# Patient Record
Sex: Female | Born: 1950 | Race: White | Hispanic: No | Marital: Married | Smoking: Former smoker
Health system: Southern US, Community
[De-identification: ages and names within clinical notes are randomized; demographics above are authoritative.]

## PROBLEM LIST (undated history)

## (undated) DIAGNOSIS — N893 Dysplasia of vagina, unspecified: Secondary | ICD-10-CM

## (undated) DIAGNOSIS — J189 Pneumonia, unspecified organism: Secondary | ICD-10-CM

## (undated) DIAGNOSIS — E039 Hypothyroidism, unspecified: Secondary | ICD-10-CM

## (undated) DIAGNOSIS — I2510 Atherosclerotic heart disease of native coronary artery without angina pectoris: Secondary | ICD-10-CM

## (undated) DIAGNOSIS — M549 Dorsalgia, unspecified: Secondary | ICD-10-CM

## (undated) DIAGNOSIS — I10 Essential (primary) hypertension: Secondary | ICD-10-CM

## (undated) DIAGNOSIS — E785 Hyperlipidemia, unspecified: Secondary | ICD-10-CM

## (undated) DIAGNOSIS — K648 Other hemorrhoids: Secondary | ICD-10-CM

## (undated) DIAGNOSIS — K589 Irritable bowel syndrome without diarrhea: Secondary | ICD-10-CM

## (undated) DIAGNOSIS — M8580 Other specified disorders of bone density and structure, unspecified site: Secondary | ICD-10-CM

## (undated) HISTORY — PX: PELVIC LAPAROSCOPY: SHX162

## (undated) HISTORY — DX: Other specified disorders of bone density and structure, unspecified site: M85.80

## (undated) HISTORY — PX: LAPAROSCOPY ABDOMEN DIAGNOSTIC: PRO50

## (undated) HISTORY — PX: ABDOMINAL HYSTERECTOMY: SHX81

## (undated) HISTORY — DX: Essential (primary) hypertension: I10

## (undated) HISTORY — DX: Dorsalgia, unspecified: M54.9

## (undated) HISTORY — PX: LEFT OOPHORECTOMY: SHX1961

## (undated) HISTORY — DX: Other hemorrhoids: K64.8

## (undated) HISTORY — DX: Hyperlipidemia, unspecified: E78.5

## (undated) HISTORY — DX: Dysplasia of vagina, unspecified: N89.3

## (undated) HISTORY — DX: Irritable bowel syndrome, unspecified: K58.9

## (undated) HISTORY — DX: Hypothyroidism, unspecified: E03.9

## (undated) HISTORY — PX: EYE SURGERY: SHX253

---

## 1998-03-30 ENCOUNTER — Other Ambulatory Visit: Admission: RE | Admit: 1998-03-30 | Discharge: 1998-03-30 | Payer: Self-pay | Admitting: Gynecology

## 1999-01-24 ENCOUNTER — Ambulatory Visit (HOSPITAL_COMMUNITY): Admission: RE | Admit: 1999-01-24 | Discharge: 1999-01-24 | Payer: Self-pay

## 1999-06-28 ENCOUNTER — Encounter: Payer: Self-pay | Admitting: Internal Medicine

## 1999-06-28 ENCOUNTER — Ambulatory Visit (HOSPITAL_COMMUNITY): Admission: RE | Admit: 1999-06-28 | Discharge: 1999-06-28 | Payer: Self-pay | Admitting: Internal Medicine

## 2000-05-27 ENCOUNTER — Other Ambulatory Visit: Admission: RE | Admit: 2000-05-27 | Discharge: 2000-05-27 | Payer: Self-pay | Admitting: Gynecology

## 2000-07-16 ENCOUNTER — Encounter (INDEPENDENT_AMBULATORY_CARE_PROVIDER_SITE_OTHER): Payer: Self-pay

## 2000-07-16 ENCOUNTER — Other Ambulatory Visit: Admission: RE | Admit: 2000-07-16 | Discharge: 2000-07-16 | Payer: Self-pay | Admitting: Gynecology

## 2000-10-15 ENCOUNTER — Other Ambulatory Visit: Admission: RE | Admit: 2000-10-15 | Discharge: 2000-10-15 | Payer: Self-pay | Admitting: Gynecology

## 2000-11-11 DIAGNOSIS — N893 Dysplasia of vagina, unspecified: Secondary | ICD-10-CM

## 2000-11-11 HISTORY — DX: Dysplasia of vagina, unspecified: N89.3

## 2001-01-05 ENCOUNTER — Other Ambulatory Visit: Admission: RE | Admit: 2001-01-05 | Discharge: 2001-01-05 | Payer: Self-pay | Admitting: Gynecology

## 2001-02-23 ENCOUNTER — Other Ambulatory Visit: Admission: RE | Admit: 2001-02-23 | Discharge: 2001-02-23 | Payer: Self-pay | Admitting: Gynecology

## 2001-02-23 ENCOUNTER — Encounter (INDEPENDENT_AMBULATORY_CARE_PROVIDER_SITE_OTHER): Payer: Self-pay

## 2001-06-16 ENCOUNTER — Other Ambulatory Visit: Admission: RE | Admit: 2001-06-16 | Discharge: 2001-06-16 | Payer: Self-pay | Admitting: Gynecology

## 2001-10-12 ENCOUNTER — Other Ambulatory Visit: Admission: RE | Admit: 2001-10-12 | Discharge: 2001-10-12 | Payer: Self-pay | Admitting: Gynecology

## 2002-04-12 ENCOUNTER — Other Ambulatory Visit: Admission: RE | Admit: 2002-04-12 | Discharge: 2002-04-12 | Payer: Self-pay | Admitting: Gynecology

## 2002-07-15 ENCOUNTER — Other Ambulatory Visit: Admission: RE | Admit: 2002-07-15 | Discharge: 2002-07-15 | Payer: Self-pay | Admitting: Gynecology

## 2003-08-16 ENCOUNTER — Other Ambulatory Visit: Admission: RE | Admit: 2003-08-16 | Discharge: 2003-08-16 | Payer: Self-pay | Admitting: Gynecology

## 2004-02-17 ENCOUNTER — Emergency Department (HOSPITAL_COMMUNITY): Admission: EM | Admit: 2004-02-17 | Discharge: 2004-02-17 | Payer: Self-pay | Admitting: *Deleted

## 2004-09-06 ENCOUNTER — Other Ambulatory Visit: Admission: RE | Admit: 2004-09-06 | Discharge: 2004-09-06 | Payer: Self-pay | Admitting: Gynecology

## 2005-09-13 ENCOUNTER — Other Ambulatory Visit: Admission: RE | Admit: 2005-09-13 | Discharge: 2005-09-13 | Payer: Self-pay | Admitting: Gynecology

## 2005-10-10 ENCOUNTER — Encounter: Admission: RE | Admit: 2005-10-10 | Discharge: 2005-10-10 | Payer: Self-pay | Admitting: Family Medicine

## 2006-07-07 ENCOUNTER — Ambulatory Visit: Payer: Self-pay | Admitting: Family Medicine

## 2006-07-10 ENCOUNTER — Ambulatory Visit: Payer: Self-pay | Admitting: Family Medicine

## 2006-08-12 ENCOUNTER — Ambulatory Visit: Payer: Self-pay | Admitting: Family Medicine

## 2006-09-19 ENCOUNTER — Other Ambulatory Visit: Admission: RE | Admit: 2006-09-19 | Discharge: 2006-09-19 | Payer: Self-pay | Admitting: Gynecology

## 2006-10-22 ENCOUNTER — Ambulatory Visit: Payer: Self-pay | Admitting: Family Medicine

## 2006-10-22 LAB — CONVERTED CEMR LAB
AST: 27 units/L (ref 0–37)
BUN: 10 mg/dL (ref 6–23)
CO2: 28 meq/L (ref 19–32)
Calcium: 9.9 mg/dL (ref 8.4–10.5)
Creatinine, Ser: 0.7 mg/dL (ref 0.4–1.2)
Glomerular Filtration Rate, Af Am: 112 mL/min/{1.73_m2}
LDL DIRECT: 174.7 mg/dL

## 2006-10-29 ENCOUNTER — Ambulatory Visit: Payer: Self-pay | Admitting: Internal Medicine

## 2006-11-26 ENCOUNTER — Ambulatory Visit: Payer: Self-pay | Admitting: Internal Medicine

## 2007-01-02 ENCOUNTER — Ambulatory Visit: Payer: Self-pay | Admitting: Family Medicine

## 2007-01-02 LAB — CONVERTED CEMR LAB
ALT: 29 units/L (ref 0–40)
HDL: 58.2 mg/dL (ref >39.0)
Triglycerides: 64 mg/dL (ref 0–149)

## 2007-01-04 DIAGNOSIS — K649 Unspecified hemorrhoids: Secondary | ICD-10-CM | POA: Insufficient documentation

## 2007-01-04 DIAGNOSIS — J309 Allergic rhinitis, unspecified: Secondary | ICD-10-CM | POA: Insufficient documentation

## 2007-01-04 DIAGNOSIS — I10 Essential (primary) hypertension: Secondary | ICD-10-CM | POA: Insufficient documentation

## 2007-01-28 ENCOUNTER — Ambulatory Visit: Payer: Self-pay | Admitting: Family Medicine

## 2007-01-28 LAB — CONVERTED CEMR LAB: TSH: 3.34 microintl units/mL (ref 0.35–5.50)

## 2007-01-29 ENCOUNTER — Encounter: Admission: RE | Admit: 2007-01-29 | Discharge: 2007-01-29 | Payer: Self-pay | Admitting: Family Medicine

## 2007-03-03 ENCOUNTER — Encounter: Admission: RE | Admit: 2007-03-03 | Discharge: 2007-05-22 | Payer: Self-pay | Admitting: Family Medicine

## 2007-03-03 ENCOUNTER — Encounter (INDEPENDENT_AMBULATORY_CARE_PROVIDER_SITE_OTHER): Payer: Self-pay | Admitting: Family Medicine

## 2007-05-01 ENCOUNTER — Ambulatory Visit: Payer: Self-pay | Admitting: Family Medicine

## 2007-05-01 DIAGNOSIS — M549 Dorsalgia, unspecified: Secondary | ICD-10-CM | POA: Insufficient documentation

## 2007-05-03 LAB — CONVERTED CEMR LAB
CO2: 34 meq/L — ABNORMAL HIGH (ref 19–32)
Chloride: 104 meq/L (ref 96–112)
Cholesterol: 168 mg/dL (ref 0–200)
GFR calc non Af Amer: 111 mL/min
Glucose, Bld: 86 mg/dL (ref 70–99)
HDL: 56.9 mg/dL (ref >39.0)
LDL Cholesterol: 95 mg/dL (ref 0–99)
Sodium: 141 meq/L (ref 135–145)

## 2007-05-04 ENCOUNTER — Telehealth (INDEPENDENT_AMBULATORY_CARE_PROVIDER_SITE_OTHER): Payer: Self-pay | Admitting: *Deleted

## 2007-09-09 ENCOUNTER — Telehealth (INDEPENDENT_AMBULATORY_CARE_PROVIDER_SITE_OTHER): Payer: Self-pay | Admitting: *Deleted

## 2007-09-17 ENCOUNTER — Ambulatory Visit: Payer: Self-pay | Admitting: Family Medicine

## 2007-09-17 LAB — CONVERTED CEMR LAB
CO2: 31 meq/L (ref 19–32)
Calcium: 9.8 mg/dL (ref 8.4–10.5)
Chloride: 102 meq/L (ref 96–112)
Creatinine, Ser: 0.6 mg/dL (ref 0.4–1.2)
Glucose, Bld: 88 mg/dL (ref 70–99)

## 2007-09-18 ENCOUNTER — Telehealth (INDEPENDENT_AMBULATORY_CARE_PROVIDER_SITE_OTHER): Payer: Self-pay | Admitting: *Deleted

## 2007-09-18 ENCOUNTER — Encounter (INDEPENDENT_AMBULATORY_CARE_PROVIDER_SITE_OTHER): Payer: Self-pay | Admitting: *Deleted

## 2007-09-22 ENCOUNTER — Other Ambulatory Visit: Admission: RE | Admit: 2007-09-22 | Discharge: 2007-09-22 | Payer: Self-pay | Admitting: Gynecology

## 2008-01-13 ENCOUNTER — Ambulatory Visit: Payer: Self-pay | Admitting: Family Medicine

## 2008-01-13 LAB — CONVERTED CEMR LAB
ALT: 24 units/L (ref 0–35)
Cholesterol: 167 mg/dL (ref 0–200)
GFR calc non Af Amer: 110 mL/min
Glucose, Bld: 95 mg/dL (ref 70–99)
HDL: 52.2 mg/dL (ref >39.0)
LDL Cholesterol: 86 mg/dL (ref 0–99)
Potassium: 4.6 meq/L (ref 3.5–5.1)
Sodium: 139 meq/L (ref 135–145)
Total CHOL/HDL Ratio: 3.2
Triglycerides: 146 mg/dL (ref 0–149)
VLDL: 29 mg/dL (ref 0–40)

## 2008-01-14 ENCOUNTER — Encounter (INDEPENDENT_AMBULATORY_CARE_PROVIDER_SITE_OTHER): Payer: Self-pay | Admitting: *Deleted

## 2008-06-09 ENCOUNTER — Ambulatory Visit: Payer: Self-pay | Admitting: Family Medicine

## 2008-06-09 DIAGNOSIS — E785 Hyperlipidemia, unspecified: Secondary | ICD-10-CM | POA: Insufficient documentation

## 2008-06-09 DIAGNOSIS — E039 Hypothyroidism, unspecified: Secondary | ICD-10-CM | POA: Insufficient documentation

## 2008-06-16 ENCOUNTER — Encounter (INDEPENDENT_AMBULATORY_CARE_PROVIDER_SITE_OTHER): Payer: Self-pay | Admitting: *Deleted

## 2008-06-16 LAB — CONVERTED CEMR LAB
ALT: 23 units/L (ref 0–35)
AST: 27 units/L (ref 0–37)
Bilirubin, Direct: 0.1 mg/dL (ref 0.0–0.3)
CO2: 29 meq/L (ref 19–32)
Calcium: 9.5 mg/dL (ref 8.4–10.5)
Chloride: 103 meq/L (ref 96–112)
Free T4: 0.8 ng/dL (ref 0.6–1.6)
GFR calc non Af Amer: 110 mL/min
LDL Cholesterol: 100 mg/dL — ABNORMAL HIGH (ref 0–99)
Sodium: 140 meq/L (ref 135–145)
T3, Free: 2.9 pg/mL (ref 2.3–4.2)
Total Bilirubin: 0.7 mg/dL (ref 0.3–1.2)
Total CHOL/HDL Ratio: 3.4

## 2008-09-14 ENCOUNTER — Ambulatory Visit: Payer: Self-pay | Admitting: Family Medicine

## 2008-09-22 ENCOUNTER — Encounter: Payer: Self-pay | Admitting: Gynecology

## 2008-09-22 ENCOUNTER — Other Ambulatory Visit: Admission: RE | Admit: 2008-09-22 | Discharge: 2008-09-22 | Payer: Self-pay | Admitting: Gynecology

## 2008-09-22 ENCOUNTER — Ambulatory Visit: Payer: Self-pay | Admitting: Gynecology

## 2008-10-18 ENCOUNTER — Ambulatory Visit: Payer: Self-pay | Admitting: Gynecology

## 2008-12-01 DIAGNOSIS — K589 Irritable bowel syndrome without diarrhea: Secondary | ICD-10-CM

## 2008-12-07 ENCOUNTER — Ambulatory Visit: Payer: Self-pay | Admitting: Internal Medicine

## 2008-12-07 DIAGNOSIS — K5900 Constipation, unspecified: Secondary | ICD-10-CM | POA: Insufficient documentation

## 2008-12-07 DIAGNOSIS — K3189 Other diseases of stomach and duodenum: Secondary | ICD-10-CM

## 2008-12-07 DIAGNOSIS — R1013 Epigastric pain: Secondary | ICD-10-CM

## 2008-12-09 ENCOUNTER — Ambulatory Visit (HOSPITAL_COMMUNITY): Admission: RE | Admit: 2008-12-09 | Discharge: 2008-12-09 | Payer: Self-pay | Admitting: Internal Medicine

## 2008-12-13 ENCOUNTER — Ambulatory Visit: Payer: Self-pay | Admitting: Family Medicine

## 2008-12-14 ENCOUNTER — Telehealth (INDEPENDENT_AMBULATORY_CARE_PROVIDER_SITE_OTHER): Payer: Self-pay | Admitting: *Deleted

## 2008-12-14 LAB — CONVERTED CEMR LAB
AST: 35 units/L (ref 0–37)
Albumin: 4 g/dL (ref 3.5–5.2)
Alkaline Phosphatase: 53 units/L (ref 39–117)
Basophils Absolute: 0 10*3/uL (ref 0.0–0.1)
Bilirubin, Direct: 0.1 mg/dL (ref 0.0–0.3)
Chloride: 103 meq/L (ref 96–112)
Cholesterol: 220 mg/dL (ref 0–200)
Eosinophils Absolute: 0.2 10*3/uL (ref 0.0–0.7)
Eosinophils Relative: 3.9 % (ref 0.0–5.0)
GFR calc Af Amer: 111 mL/min
GFR calc non Af Amer: 92 mL/min
HCT: 40.9 % (ref 36.0–46.0)
HDL: 62.9 mg/dL (ref >39.0)
MCV: 86.7 fL (ref 78.0–100.0)
Monocytes Absolute: 0.2 10*3/uL (ref 0.1–1.0)
Neutrophils Relative %: 58.2 % (ref 43.0–77.0)
Platelets: 245 10*3/uL (ref 150–400)
Potassium: 4.6 meq/L (ref 3.5–5.1)
RDW: 11.8 % (ref 11.5–14.6)
Sodium: 139 meq/L (ref 135–145)
Total Bilirubin: 0.6 mg/dL (ref 0.3–1.2)
Triglycerides: 86 mg/dL (ref 0–149)
WBC: 5.2 10*3/uL (ref 4.5–10.5)

## 2009-01-06 ENCOUNTER — Telehealth (INDEPENDENT_AMBULATORY_CARE_PROVIDER_SITE_OTHER): Payer: Self-pay | Admitting: *Deleted

## 2009-02-02 ENCOUNTER — Telehealth (INDEPENDENT_AMBULATORY_CARE_PROVIDER_SITE_OTHER): Payer: Self-pay | Admitting: *Deleted

## 2009-03-15 ENCOUNTER — Ambulatory Visit: Payer: Self-pay | Admitting: Family Medicine

## 2009-03-15 ENCOUNTER — Encounter (INDEPENDENT_AMBULATORY_CARE_PROVIDER_SITE_OTHER): Payer: Self-pay | Admitting: *Deleted

## 2009-03-15 LAB — CONVERTED CEMR LAB: TSH: 2.52 microintl units/mL (ref 0.35–5.50)

## 2009-06-13 ENCOUNTER — Ambulatory Visit: Payer: Self-pay | Admitting: Family Medicine

## 2009-06-15 ENCOUNTER — Telehealth (INDEPENDENT_AMBULATORY_CARE_PROVIDER_SITE_OTHER): Payer: Self-pay | Admitting: *Deleted

## 2009-06-19 ENCOUNTER — Ambulatory Visit: Payer: Self-pay | Admitting: Family Medicine

## 2009-06-19 DIAGNOSIS — J029 Acute pharyngitis, unspecified: Secondary | ICD-10-CM

## 2009-08-12 IMAGING — US US ABDOMEN COMPLETE
1 series · 14 of 25 positions shown · non-contrast
Comparison: None.

CLINICAL DATA: Nausea.  Left upper quadrant pain.

ABDOMEN ULTRASOUND
TECHNIQUE: Complete abdominal ultrasound examination was performed
including evaluation of the liver, gallbladder, bile ducts,
pancreas, kidneys, spleen, IVC, and abdominal aorta.

[Series 1: us abdomen complete · 0.21mm/px · 14 of 105 slices shown]
[im 1/105]
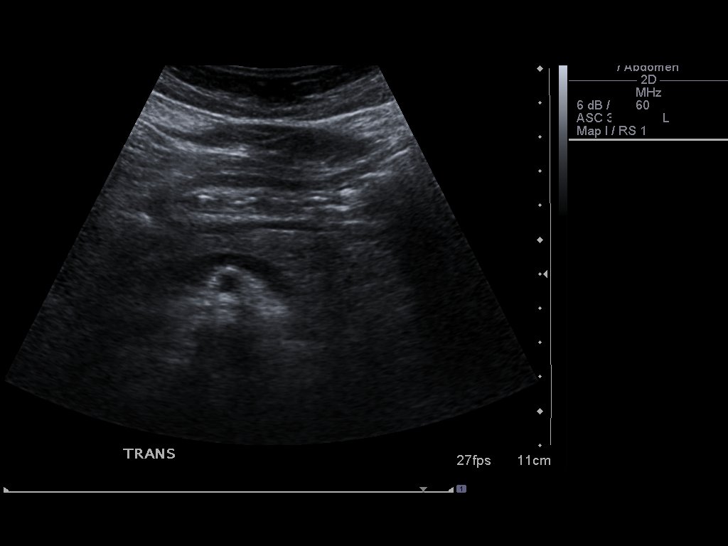
[im 9/105]
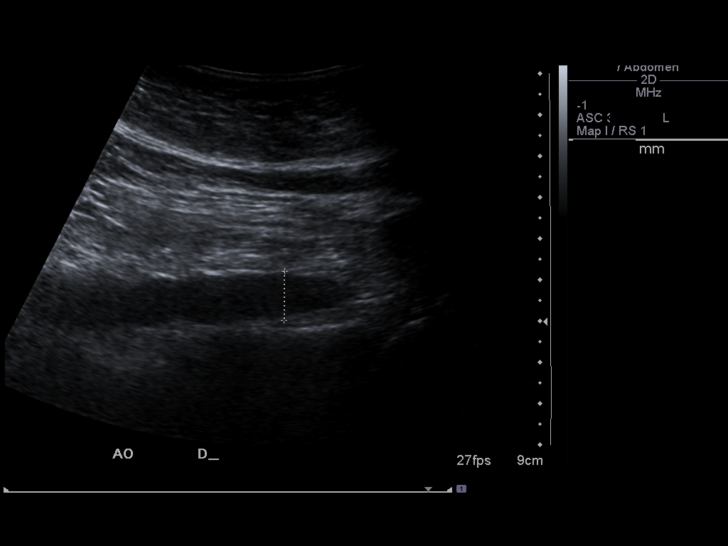
[im 18/105]
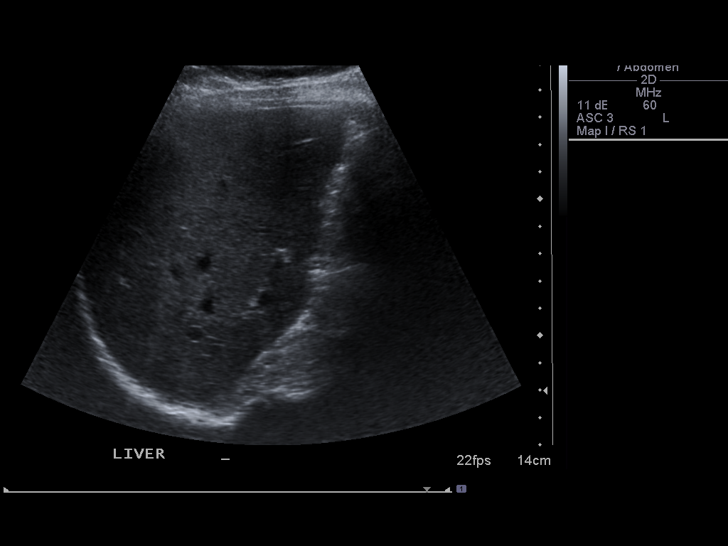
[im 27/105]
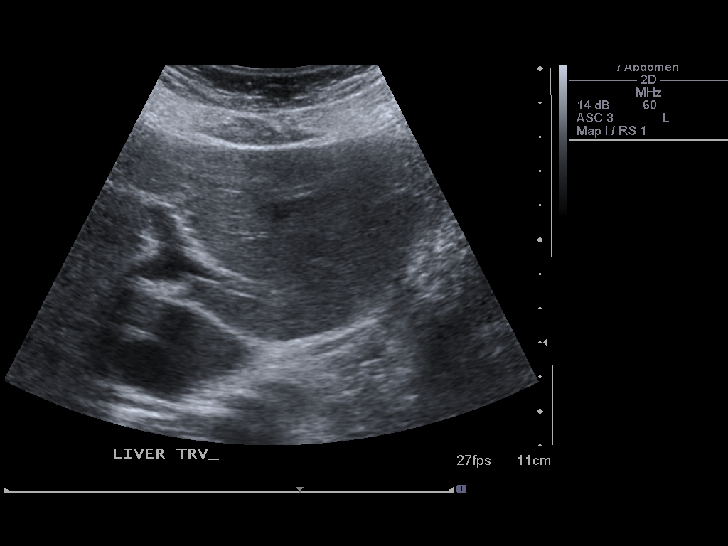
[im 35/105]
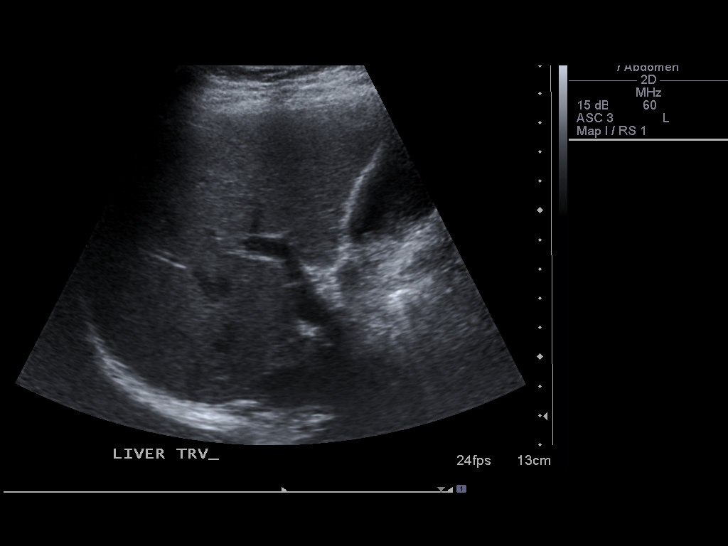
[im 40/105]
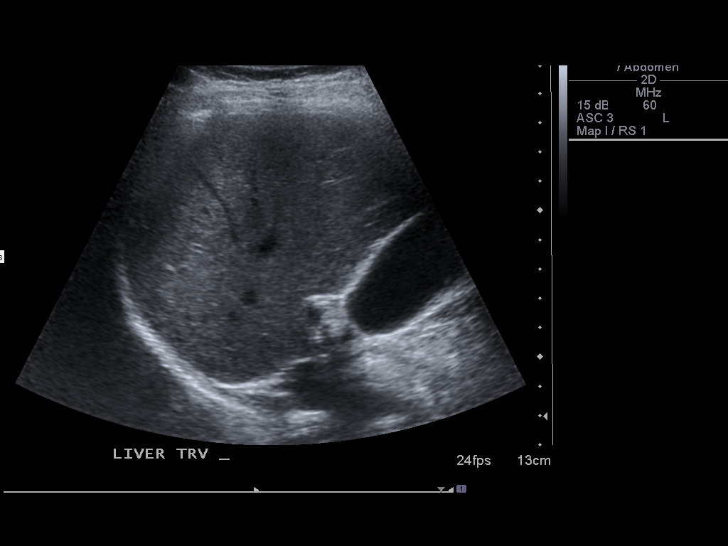
[im 48/105]
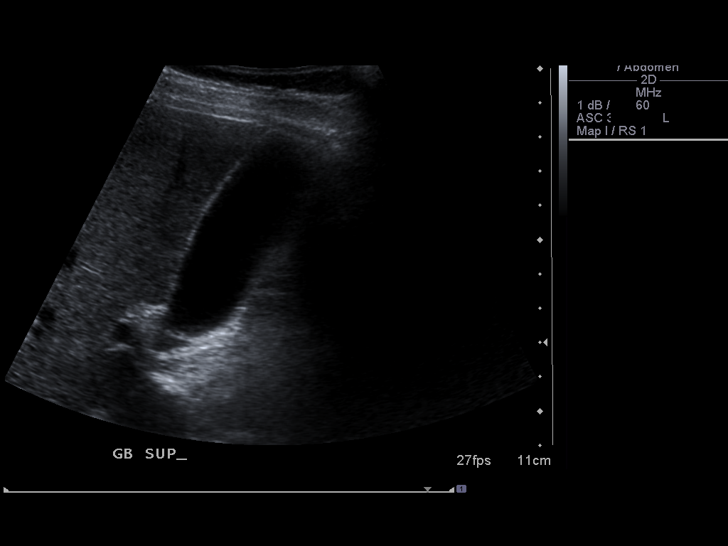
[im 57/105]
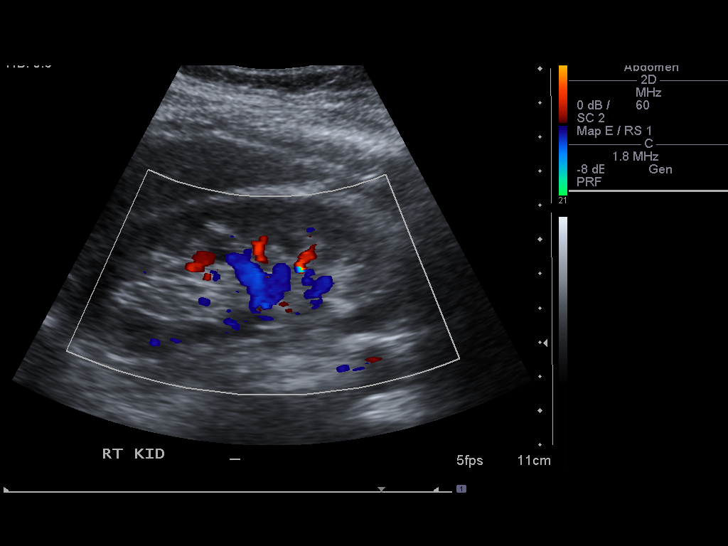
[im 66/105]
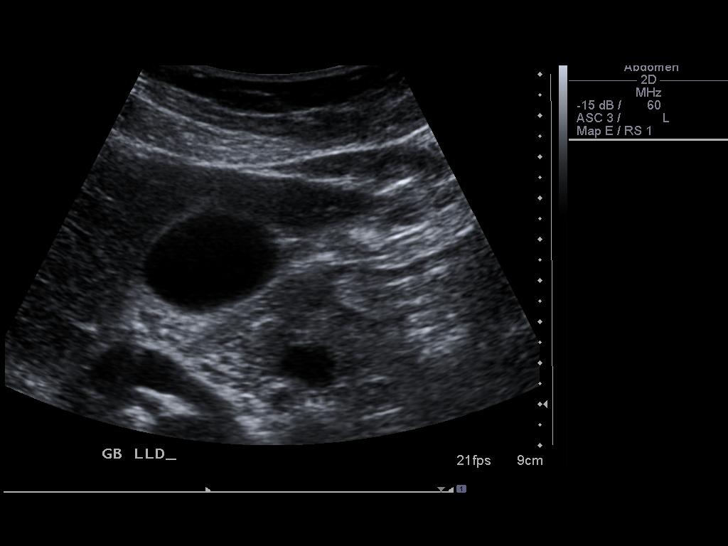
[im 70/105]
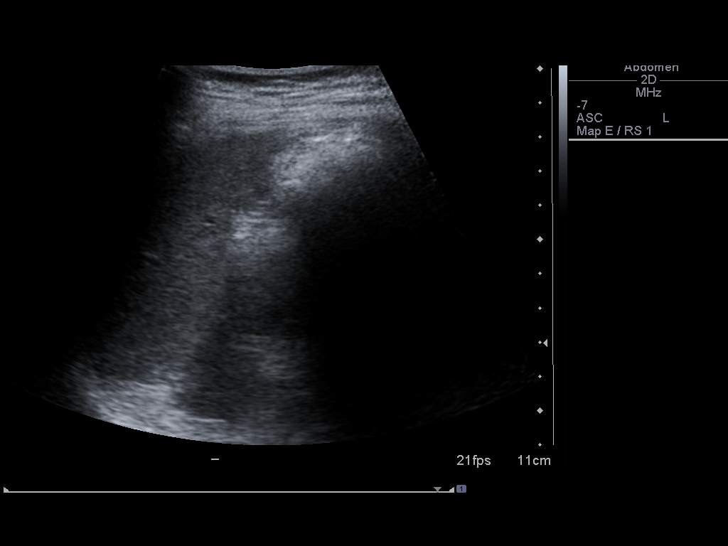
[im 79/105]
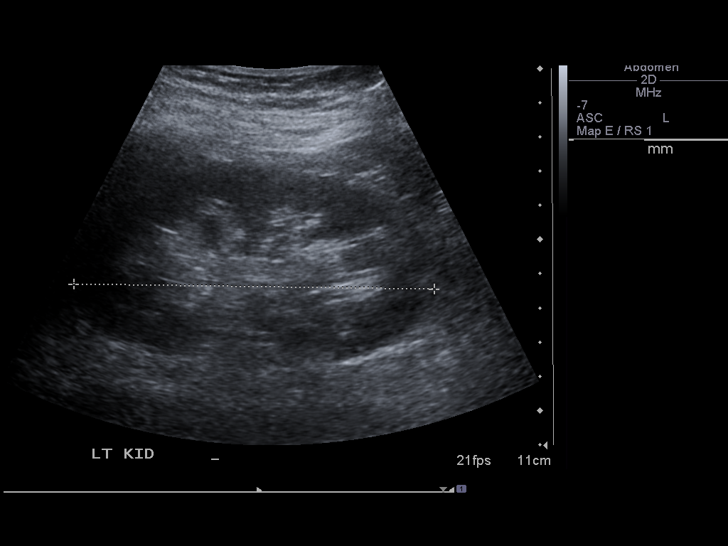
[im 87/105]
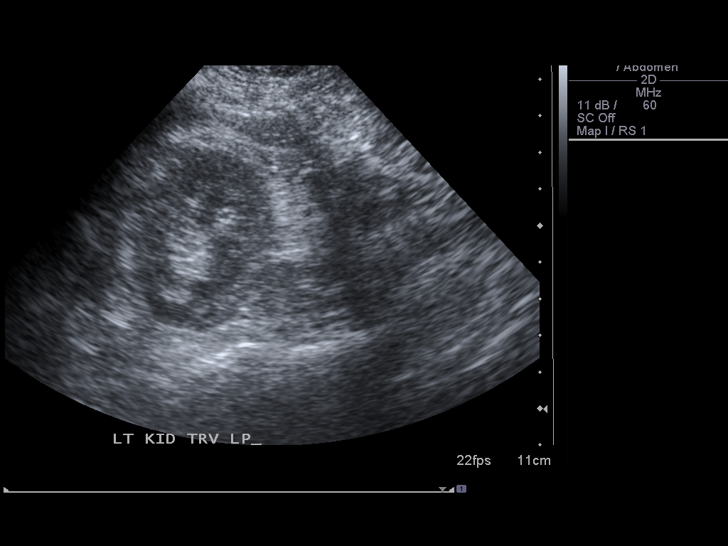
[im 96/105]
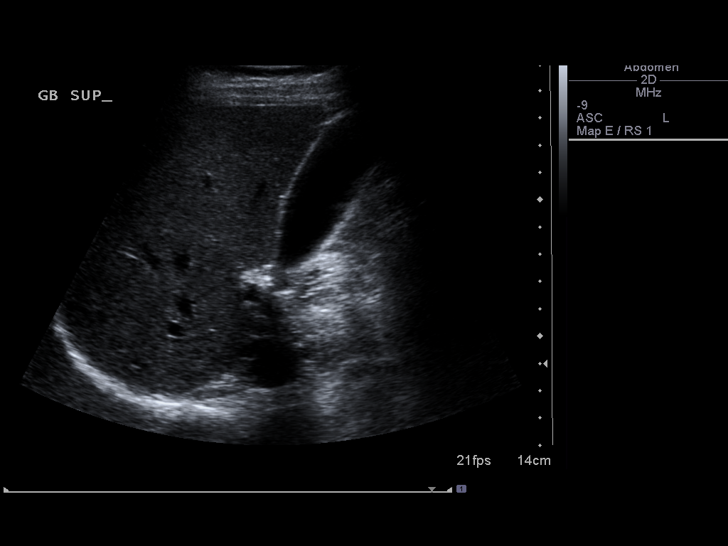
[im 105/105]
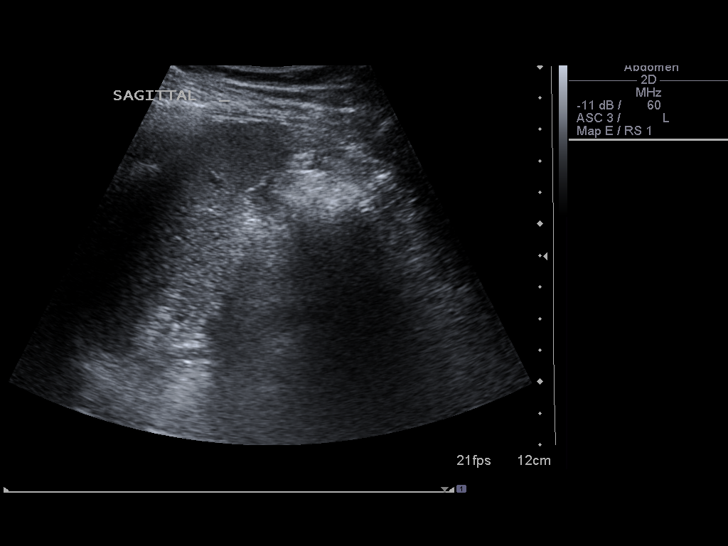

[14 of 25 positions shown; findings below may reference images not displayed]

FINDINGS: Gallbladder:  There is no evidence for gallstones, gallbladder wall
thickening or pericholecystic fluid.  4 mm cholesterol polyp
identified.  The sonographer reports no sonographic Murphy's sign.

Common Bile Duct:  Nondilated at 5 mm diameter.

Liver:  Normal

IVC:  Normal

Pancreas:  Normal

Spleen:  Normal

Right kidney:  11.6 cm in long axis.  Normal

Left kidney:  11.1 cm in long axis.  Normal

Abdominal Aorta:  No aneurysm.  Aorta is 2.0 cm in maximum
diameter.
IMPRESSION: Tiny cholesterol polyp in the gallbladder.  Otherwise normal exam.

## 2009-08-29 LAB — HM MAMMOGRAPHY: HM Mammogram: NORMAL

## 2009-09-25 ENCOUNTER — Encounter: Payer: Self-pay | Admitting: Gynecology

## 2009-09-25 ENCOUNTER — Ambulatory Visit: Payer: Self-pay | Admitting: Gynecology

## 2009-09-25 ENCOUNTER — Other Ambulatory Visit: Admission: RE | Admit: 2009-09-25 | Discharge: 2009-09-25 | Payer: Self-pay | Admitting: Gynecology

## 2009-10-25 ENCOUNTER — Telehealth (INDEPENDENT_AMBULATORY_CARE_PROVIDER_SITE_OTHER): Payer: Self-pay | Admitting: *Deleted

## 2009-10-27 ENCOUNTER — Encounter (INDEPENDENT_AMBULATORY_CARE_PROVIDER_SITE_OTHER): Payer: Self-pay | Admitting: *Deleted

## 2009-10-31 ENCOUNTER — Ambulatory Visit: Payer: Self-pay | Admitting: Family

## 2009-10-31 DIAGNOSIS — G4700 Insomnia, unspecified: Secondary | ICD-10-CM | POA: Insufficient documentation

## 2009-11-02 ENCOUNTER — Encounter: Payer: Self-pay | Admitting: Internal Medicine

## 2009-11-02 ENCOUNTER — Telehealth (INDEPENDENT_AMBULATORY_CARE_PROVIDER_SITE_OTHER): Payer: Self-pay | Admitting: *Deleted

## 2009-11-08 ENCOUNTER — Telehealth: Payer: Self-pay | Admitting: Internal Medicine

## 2009-11-14 ENCOUNTER — Telehealth (INDEPENDENT_AMBULATORY_CARE_PROVIDER_SITE_OTHER): Payer: Self-pay | Admitting: *Deleted

## 2009-11-16 ENCOUNTER — Ambulatory Visit: Payer: Self-pay | Admitting: Family Medicine

## 2009-11-17 LAB — CONVERTED CEMR LAB
Bilirubin, Direct: 0 mg/dL (ref 0.0–0.3)
Total Bilirubin: 0.7 mg/dL (ref 0.3–1.2)

## 2009-12-14 ENCOUNTER — Telehealth (INDEPENDENT_AMBULATORY_CARE_PROVIDER_SITE_OTHER): Payer: Self-pay | Admitting: *Deleted

## 2010-02-16 ENCOUNTER — Ambulatory Visit: Payer: Self-pay | Admitting: Family Medicine

## 2010-02-16 LAB — CONVERTED CEMR LAB
Calcium: 10 mg/dL (ref 8.4–10.5)
Creatinine, Ser: 0.5 mg/dL (ref 0.4–1.2)
GFR calc non Af Amer: 134.98 mL/min (ref >60)
HDL: 58.8 mg/dL (ref >39.00)
LDL Cholesterol: 88 mg/dL (ref 0–99)
Total CHOL/HDL Ratio: 3
Triglycerides: 70 mg/dL (ref 0.0–149.0)
VLDL: 14 mg/dL (ref 0.0–40.0)

## 2010-02-20 ENCOUNTER — Telehealth (INDEPENDENT_AMBULATORY_CARE_PROVIDER_SITE_OTHER): Payer: Self-pay | Admitting: *Deleted

## 2010-03-14 ENCOUNTER — Ambulatory Visit: Payer: Self-pay | Admitting: Family Medicine

## 2010-03-14 DIAGNOSIS — M79609 Pain in unspecified limb: Secondary | ICD-10-CM | POA: Insufficient documentation

## 2010-05-02 ENCOUNTER — Encounter: Payer: Self-pay | Admitting: Family Medicine

## 2010-08-15 ENCOUNTER — Ambulatory Visit: Payer: Self-pay | Admitting: Family Medicine

## 2010-08-16 LAB — CONVERTED CEMR LAB
Alkaline Phosphatase: 67 units/L (ref 39–117)
BUN: 19 mg/dL (ref 6–23)
Basophils Absolute: 0 10*3/uL (ref 0.0–0.1)
Bilirubin, Direct: 0.1 mg/dL (ref 0.0–0.3)
Chloride: 102 meq/L (ref 96–112)
Cholesterol: 187 mg/dL (ref 0–200)
Creatinine, Ser: 0.6 mg/dL (ref 0.4–1.2)
Eosinophils Absolute: 0.2 10*3/uL (ref 0.0–0.7)
Eosinophils Relative: 3.9 % (ref 0.0–5.0)
LDL Cholesterol: 101 mg/dL — ABNORMAL HIGH (ref 0–99)
MCHC: 33.9 g/dL (ref 30.0–36.0)
MCV: 87.2 fL (ref 78.0–100.0)
Monocytes Absolute: 0.3 10*3/uL (ref 0.1–1.0)
Neutrophils Relative %: 53.6 % (ref 43.0–77.0)
Platelets: 272 10*3/uL (ref 150.0–400.0)
TSH: 2.76 microintl units/mL (ref 0.35–5.50)
Total Protein: 7.1 g/dL (ref 6.0–8.3)
Vit D, 25-Hydroxy: 53 ng/mL (ref 30–89)
WBC: 5 10*3/uL (ref 4.5–10.5)

## 2010-08-23 ENCOUNTER — Telehealth (INDEPENDENT_AMBULATORY_CARE_PROVIDER_SITE_OTHER): Payer: Self-pay | Admitting: *Deleted

## 2010-09-26 ENCOUNTER — Ambulatory Visit: Payer: Self-pay | Admitting: Gynecology

## 2010-09-26 ENCOUNTER — Other Ambulatory Visit: Admission: RE | Admit: 2010-09-26 | Discharge: 2010-09-26 | Payer: Self-pay | Admitting: Gynecology

## 2010-10-23 ENCOUNTER — Telehealth: Payer: Self-pay | Admitting: Family Medicine

## 2010-10-30 ENCOUNTER — Ambulatory Visit: Payer: Self-pay | Admitting: Gynecology

## 2010-12-01 ENCOUNTER — Encounter: Payer: Self-pay | Admitting: Family Medicine

## 2010-12-09 LAB — CONVERTED CEMR LAB
Albumin: 4.3 g/dL (ref 3.5–5.2)
Alkaline Phosphatase: 69 units/L (ref 39–117)
Basophils Relative: 0.4 % (ref 0.0–3.0)
Calcium: 9.9 mg/dL (ref 8.4–10.5)
Chloride: 106 meq/L (ref 96–112)
Cholesterol: 226 mg/dL — ABNORMAL HIGH (ref 0–200)
Creatinine, Ser: 0.7 mg/dL (ref 0.4–1.2)
Direct LDL: 151.5 mg/dL
Eosinophils Relative: 4.7 % (ref 0.0–5.0)
Hemoglobin: 14.6 g/dL (ref 12.0–15.0)
Lymphocytes Relative: 35.8 % (ref 12.0–46.0)
MCV: 87.6 fL (ref 78.0–100.0)
Neutro Abs: 2.1 10*3/uL (ref 1.4–7.7)
Neutrophils Relative %: 52.4 % (ref 43.0–77.0)
RBC: 4.81 M/uL (ref 3.87–5.11)
Sodium: 144 meq/L (ref 135–145)
Total CHOL/HDL Ratio: 4
Total Protein: 7.7 g/dL (ref 6.0–8.3)
Triglycerides: 112 mg/dL (ref 0.0–149.0)
VLDL: 22.4 mg/dL (ref 0.0–40.0)
Vit D, 25-Hydroxy: 45 ng/mL (ref 30–89)
WBC: 4.1 10*3/uL — ABNORMAL LOW (ref 4.5–10.5)

## 2010-12-13 NOTE — Assessment & Plan Note (Signed)
Summary: cpx & lab/cbs  Flu Vaccine Consent Questions     Do you have a history of severe allergic reactions to this vaccine? no    Any prior history of allergic reactions to egg and/or gelatin? no    Do you have a sensitivity to the preservative Thimersol? no    Do you have a past history of Guillan-Barre Syndrome? no    Do you currently have an acute febrile illness? no    Have you ever had a severe reaction to latex? no    Vaccine information given and explained to patient? yes    Are you currently pregnant? no    Lot Number:[id number]   Exp Date:05/11/2011   Site Given  Right Deltoid IM    Vital Signs:  Patient profile:   60 year old female Height:      60 inches Weight:      114 pounds BMI:     22.34 Pulse rate:   80 / minute BP sitting:   120 / 78  (left arm)  Vitals Entered By: Doristine Devoid CMA (August 15, 2010 8:07 AM) CC: CPX AND LABS    History of Present Illness: 60 yo woman here today for CPE.  GYN- [facility].  UTD on mammogram, colonoscopy.  1) HTN- BP well controlled on amlodipine, HCTZ.  no CP, SOB, HAs, visual changes.  some edema of feet over the last few months.  2) Hyperlipidemia- tolerating statin w/out difficulty.  no N/V, myalgias.  3) Hypothyroid- feels sxs are well controlled at current dose, has been stable for awhile.  Preventive Screening-Counseling & Management  Alcohol-Tobacco     Alcohol drinks/day: <1     Smoking Status: never  Caffeine-Diet-Exercise     Does Patient Exercise: yes     Type of exercise: walking 3-4 miles     Times/week: 4      Sexual History:  currently monogamous.        Drug Use:  never.    Current Medications (verified): 1)  Xyzal 5 Mg  Tabs (Levocetirizine Dihydrochloride) .... Take One Tablet Daily 2)  Norvasc 5 Mg  Tabs (Amlodipine Besylate) .Marland Kitchen.. 1 By Mouth Qd 3)  Hydrochlorothiazide 25 Mg Tabs (Hydrochlorothiazide) .... Take 1  Tablet By Mouth Every Morning 4)  Synthroid 75 Mcg Tabs (Levothyroxine Sodium)  .... Take One Tablet Daily 5)  Calcium 600/vitamin D 600-400 Mg-Unit  Chew (Calcium Carbonate-Vitamin D) .Marland Kitchen.. 1 By Mouth Three Times A Day 6)  Levbid 0.375 Mg Xr12h-Tab (Hyoscyamine Sulfate) .... Take 1 Tablet By Mouth Two Times A Day 7)  Simvastatin 40 Mg Tabs (Simvastatin) .Marland Kitchen.. 1 By Mouth At Bedtime 8)  Ambien 5 Mg Tabs (Zolpidem Tartrate) .... One Tablet At Bedtime As Needed For Insomnia  Allergies (verified): 1)  ! Penicillin 2)  ! Sulfa 3)  ! Tetracycline  Past History:  Past Surgical History: Last updated: 12/01/2008 hysterectomy left oophorectomy  Family History: Last updated: 06/13/2009 Family History of Colitis/Crohn's: Sister Family History of Heart Disease: Mother Family History of Colon Cancer: Mother breast cancer- NO DM- mother HTN- mother, father, 2 sisters, son Hyperlipidemia- mother, father  Social History: Last updated: 06/13/2009 Occupation:  paralegal Married, 2 sons, 1 daughter- (66, 47, 19) Never Smoked Alcohol use-yes Drug use-no Regular exercise-yes  Review of Systems       The patient complains of peripheral edema.  The patient denies anorexia, fever, weight loss, weight gain, vision loss, decreased hearing, hoarseness, chest pain, syncope, dyspnea on exertion, prolonged  cough, headaches, abdominal pain, melena, hematochezia, severe indigestion/heartburn, hematuria, suspicious skin lesions, depression, abnormal bleeding, enlarged lymph nodes, and breast masses.    Physical Exam  General:  Well-developed,well-nourished,in no acute distress; alert,appropriate and cooperative throughout examination Head:  Normocephalic and atraumatic without obvious abnormalities.  Eyes:  PERRL, EOMI, fundi WNL Ears:  External ear exam shows no significant lesions or deformities.  Otoscopic examination reveals clear canals, tympanic membranes are intact bilaterally without bulging, retraction, inflammation or discharge. Hearing is grossly normal bilaterally. Nose:   External nasal examination shows no deformity or inflammation. Nasal mucosa are pink and moist without lesions or exudates. Mouth:  Oral mucosa and oropharynx without lesions or exudates.  Teeth in good repair.  Neck:  No deformities, masses, or tenderness noted. Breasts:  deferred to gyn Lungs:  Normal respiratory effort, chest expands symmetrically. Lungs are clear to auscultation, no crackles or wheezes. Heart:  Normal rate and regular rhythm. S1 and S2 normal without gallop, murmur, click, rub or other extra sounds. Abdomen:  soft, NT/ND, +BS Genitalia:  deferred to gyn Pulses:  +2 carotid, radial, DP Extremities:  No clubbing, cyanosis, edema  Neurologic:  No cranial nerve deficits noted. Station and gait are normal. Plantar reflexes are down-going bilaterally. DTRs are symmetrical throughout. Sensory, motor and coordinative functions appear intact. Skin:  Intact without suspicious lesions or rashes Cervical Nodes:  No lymphadenopathy noted Inguinal Nodes:  No significant adenopathy Psych:  Cognition and judgment appear intact. Alert and cooperative with normal attention span and concentration. No apparent delusions, illusions, hallucinations   Impression & Recommendations:  Problem # 1:  PHYSICAL EXAMINATION (ICD-V70.0) Assessment Unchanged pt's PE WNL.  UTD on health maintainence.  anticipatory guidance provided. Orders: T-Vitamin D (25-Hydroxy) [phone]) Specimen Handling (56433)  Problem # 2:  HYPERTENSION (ICD-401.9) Assessment: Unchanged well controlled.  asymptomatic.  check labs.  no med changes at this time. Her updated medication list for this problem includes:    Norvasc 5 Mg Tabs (Amlodipine besylate) .Marland Kitchen... 1 by mouth qd    Hydrochlorothiazide 25 Mg Tabs (Hydrochlorothiazide) .Marland Kitchen... Take 1  tablet by mouth every morning  Orders: TLB-BMP (Basic Metabolic Panel-BMET) (80048-METABOL) TLB-CBC Platelet - w/Differential (85025-CBCD)  Problem # 3:  HYPERLIPIDEMIA  (ICD-272.4) Assessment: Unchanged tolerating statin w/out difficulty.  assess level of control w/ labs.  will likely switch Simvastatin for lipitor when it goes generic due to recent published studies on side effects. Her updated medication list for this problem includes:    Simvastatin 40 Mg Tabs (Simvastatin) .Marland Kitchen... 1 by mouth at bedtime  Orders: TLB-Lipid Panel (80061-LIPID) TLB-Hepatic/Liver Function Pnl (80076-HEPATIC) Specimen Handling (29518)  Problem # 4:  HYPOTHYROIDISM (ICD-244.9) Assessment: Unchanged check labs.  adjust meds as needed. Her updated medication list for this problem includes:    Synthroid 75 Mcg Tabs (Levothyroxine sodium) .Marland Kitchen... Take one tablet daily  Orders: Venipuncture (84166) TLB-TSH (Thyroid Stimulating Hormone) (84443-TSH) Specimen Handling (06301)  Complete Medication List: 1)  Xyzal 5 Mg Tabs (Levocetirizine dihydrochloride) .... Take one tablet daily 2)  Norvasc 5 Mg Tabs (Amlodipine besylate) .Marland Kitchen.. 1 by mouth qd 3)  Hydrochlorothiazide 25 Mg Tabs (Hydrochlorothiazide) .... Take 1  tablet by mouth every morning 4)  Synthroid 75 Mcg Tabs (Levothyroxine sodium) .... Take one tablet daily 5)  Calcium 600/vitamin D 600-400 Mg-unit Chew (Calcium carbonate-vitamin d) .Marland Kitchen.. 1 by mouth three times a day 6)  Levbid 0.375 Mg Xr12h-tab (Hyoscyamine sulfate) .... Take 1 tablet by mouth two times a day 7)  Simvastatin 40 Mg Tabs (  Simvastatin) .Marland Kitchen.. 1 by mouth at bedtime 8)  Ambien 5 Mg Tabs (Zolpidem tartrate) .... One tablet at bedtime as needed for insomnia  Other Orders: Admin 1st Vaccine (60454) Flu Vaccine 2yrs + [phone])  Patient Instructions: 1)  Follow up in 6 months to recheck cholesterol and blood pressure 2)  We'll notify you of your lab results 3)  Keep up the good work on diet and exercise! 4)  Call with any questions or concerns 5)  Have a great holiday season!!! Prescriptions: AMBIEN 5 MG TABS (ZOLPIDEM TARTRATE) one tablet at bedtime as  needed for insomnia  #30 x 1   Entered and Authorized by:   Neena Rhymes MD   Signed by:   Neena Rhymes MD on 08/15/2010   Method used:   Print then Give to Patient   RxID:   [id number] HYDROCHLOROTHIAZIDE 25 MG TABS (HYDROCHLOROTHIAZIDE) Take 1  tablet by mouth every morning  #90 x 3   Entered and Authorized by:   Neena Rhymes MD   Signed by:   Neena Rhymes MD on 08/15/2010   Method used:   Faxed to ...       [facility] (mail-order)             , [place]         Ph: [phone]       Fax: [phone]   RxID:   [id number] SIMVASTATIN 40 MG TABS (SIMVASTATIN) 1 by mouth at bedtime  #90 x 3   Entered and Authorized by:   Neena Rhymes MD   Signed by:   Neena Rhymes MD on 08/15/2010   Method used:   Faxed to ...       [facility] (mail-order)             , [place]         Ph: [phone]       Fax: [phone]   RxID:   [id number] NORVASC 5 MG  TABS (AMLODIPINE BESYLATE) 1 by mouth qd  #90 x 3   Entered and Authorized by:   Neena Rhymes MD   Signed by:   Neena Rhymes MD on 08/15/2010   Method used:   Faxed to ...       [facility] (mail-order)             , [place]         Ph: [phone]       Fax: [phone]   RxID:   [id number]

## 2010-12-13 NOTE — Progress Notes (Signed)
  Phone Note Refill Request Message from:  Fax from Pharmacy on December 14, 2009 2:56 PM  Refills Requested: Medication #1:  SIMVASTATIN 40 MG TABS 1 by mouth at bedtime  Method Requested: [facility] Initial call taken by: Shary Decamp,  December 14, 2009 2:56 PM    Prescriptions: SIMVASTATIN 40 MG TABS (SIMVASTATIN) 1 by mouth at bedtime  #90 x 1   Entered by:   Shary Decamp   Authorized by:   Neena Rhymes MD   Signed by:   Shary Decamp on 12/14/2009   Method used:   Electronically to        [facility]  [address] [id number]* (retail)       [address]       [place], [place]  [address]       Ph: [phone] or [phone]       Fax: [phone]   RxID:   [id number]

## 2010-12-13 NOTE — Progress Notes (Signed)
Summary: Refill  Phone Note Call from Patient Call back at Home Phone [phone]   Caller: Patient Reason for Call: Refill Medication Summary of Call: Patient has been having trouble with [facility] sending over a refill request for her Simvastatin 40mg . She wanted to know if we could call in a 2 week prescription to [facility] on [address] to hold her over until she gets things worked out with [employer]. She says she has already been a few days without it. Thanks. Initial call taken by: Harold Barban,  December 14, 2009 1:14 PM    Prescriptions: SIMVASTATIN 40 MG TABS (SIMVASTATIN) 1 by mouth at bedtime  #30 Tablet x 0   Entered by:   Shary Decamp   Authorized by:   Neena Rhymes MD   Signed by:   Shary Decamp on 12/14/2009   Method used:   Electronically to        [facility]  [address] [facility]* (retail)       [address]       [place], [place]  [address]       Ph: [phone] or [phone]       Fax: [phone]   RxID:   [id number]

## 2010-12-13 NOTE — Progress Notes (Signed)
Summary: change med-left msg to return call --called back--left cell #  Phone Note From Pharmacy Call back at [facility] Phone [phone]   Summary of Call: received interaction report on simvastatin and amlodipine Initial call taken by: Doristine Devoid CMA,  August 23, 2010 11:14 AM  Follow-up for Phone Call        can switch to Lipitor 20mg .  will be generic after Thanksgiving.  there is a potential risk but we monitor labs every 6 months and people who have been taking this combo for over a year are at low risk for side effects. Follow-up by: Neena Rhymes MD,  August 23, 2010 11:32 AM  Additional Follow-up for Phone Call Additional follow up Details #1::        left msg to have patient return call.........Marland KitchenDoristine Devoid CMA  August 23, 2010 4:31 PM  left message to call office.................Marland KitchenFelecia Deloach CMA  August 24, 2010 9:12 AM     Additional Follow-up for Phone Call Additional follow up Details #2::    PLEASE CALL HER ON HER CELL = [phone].Marland KitchenMarland KitchenJerolyn Shin  August 24, 2010 9:50 AM  Additional Follow-up for Phone Call Additional follow up Details #3:: Details for Additional Follow-up Action Taken: pt aware, rx sent to pharmacy, discount copay card mailed to pt.................Marland KitchenFelecia Deloach CMA  August 24, 2010 11:17 AM   New/Updated Medications: LIPITOR 20 MG TABS (ATORVASTATIN CALCIUM) Take 1 tab at bedtime Prescriptions: LIPITOR 20 MG TABS (ATORVASTATIN CALCIUM) Take 1 tab at bedtime  #30 x 2   Entered by:   Jeremy Johann CMA   Authorized by:   Neena Rhymes MD   Signed by:   Jeremy Johann CMA on 08/24/2010   Method used:   Faxed to ...       [facility]  [address] [facility]* (retail)       [address]       [place], [place]  [address]       Ph: [phone] or [phone]       Fax: [phone]   RxID:   [id number]

## 2010-12-13 NOTE — Assessment & Plan Note (Signed)
Summary: 6 month ov and cholesterol check//ph   Vital Signs:  Patient profile:   60 year old female Weight:      110 pounds Pulse rate:   72 / minute BP sitting:   118 / 80  (left arm)  Vitals Entered By: Doristine Devoid (February 16, 2010 8:25 AM) CC: 6 month/ labs    History of Present Illness: 60 yo woman here today for 6 mo f/u on  1) HTN- BP excellently controlled on Norvasc and HCTZ.  no CP, SOB, HAs, visual changes, edema.  2) Hyperlipidemia- tolerating statin w/out myalgias or N/V.  fasting today.  3) Insomnia- got Ambien from [facility] in Dec.  using sporadically.  works well when she uses it.  would like refill.  reports poor sleep due to work stress (budget cuts)  Preventive Screening-Counseling & Management  Alcohol-Tobacco     Smoking Status: never  Problems Prior to Update: 1)  Insomnia  (ICD-780.52) 2)  Sore Throat  (ICD-462) 3)  Physical Examination  (ICD-V70.0) 4)  Constipation  (ICD-564.00) 5)  Dyspepsia  (ICD-536.8) 6)  Irritable Bowel Syndrome  (ICD-564.1) 7)  Fh of Colon Cancer  (ICD-153.9) 8)  Hypothyroidism  (ICD-244.9) 9)  Hyperlipidemia  (ICD-272.4) 10)  Back Pain  (ICD-724.5) 11)  Hemorrhoids Nos W/complication Nec  (ICD-455.8) 12)  Hypertension  (ICD-401.9) 13)  Allergic Rhinitis  (ICD-477.9)  Current Medications (verified): 1)  Xyzal 5 Mg  Tabs (Levocetirizine Dihydrochloride) .... Take One Tablet Daily 2)  Norvasc 5 Mg  Tabs (Amlodipine Besylate) .Marland Kitchen.. 1 By Mouth Qd 3)  Hydrochlorothiazide 25 Mg Tabs (Hydrochlorothiazide) .... Take 1/2  Tablet By Mouth Every Morning 4)  Synthroid 75 Mcg Tabs (Levothyroxine Sodium) .... Take One Tablet Daily 5)  Calcium 600/vitamin D 600-400 Mg-Unit  Chew (Calcium Carbonate-Vitamin D) .Marland Kitchen.. 1 By Mouth Three Times A Day 6)  Levbid 0.375 Mg Xr12h-Tab (Hyoscyamine Sulfate) .... Take 1 Tablet By Mouth Two Times A Day 7)  Simvastatin 40 Mg Tabs (Simvastatin) .Marland Kitchen.. 1 By Mouth At Bedtime 8)  Ambien 5 Mg Tabs (Zolpidem  Tartrate) .... One Tablet At Bedtime As Needed For Insomnia  Allergies (verified): 1)  ! Penicillin 2)  ! Sulfa 3)  ! Tetracycline  Past History:  Family History: Last updated: 06/13/2009 Family History of Colitis/Crohn's: Sister Family History of Heart Disease: Mother Family History of Colon Cancer: Mother breast cancer- NO DM- mother HTN- mother, father, 2 sisters, son Hyperlipidemia- mother, father  Review of Systems      See HPI  Physical Exam  General:  Well-developed,well-nourished,in no acute distress; alert,appropriate and cooperative throughout examination Head:  Normocephalic and atraumatic without obvious abnormalities.  Eyes:  PERRL, EOMI Neck:  No deformities, masses, or tenderness noted. Lungs:  Normal respiratory effort, chest expands symmetrically. Lungs are clear to auscultation, no crackles or wheezes. Heart:  Normal rate and regular rhythm. S1 and S2 normal without gallop, murmur, click, rub or other extra sounds. Abdomen:  soft, NT/ND, +BS Pulses:  +2 carotid, radial, DP Extremities:  No clubbing, cyanosis, edema    Impression & Recommendations:  Problem # 1:  HYPERTENSION (ICD-401.9) Assessment Unchanged Pt's BP is excellent.  asymptomatic.  cont current meds.  check BMP to assess K+ Her updated medication list for this problem includes:    Norvasc 5 Mg Tabs (Amlodipine besylate) .Marland Kitchen... 1 by mouth qd    Hydrochlorothiazide 25 Mg Tabs (Hydrochlorothiazide) .Marland Kitchen... Take 1/2  tablet by mouth every morning  Orders: TLB-BMP (Basic Metabolic Panel-BMET) (80048-METABOL)  Problem # 2:  HYPERLIPIDEMIA (ICD-272.4) Assessment: Unchanged due for lipids today.  LFTs in January were WNL.  tolerating meds w/out problem. Her updated medication list for this problem includes:    Simvastatin 40 Mg Tabs (Simvastatin) .Marland Kitchen... 1 by mouth at bedtime  Orders: Venipuncture (14782) TLB-Lipid Panel (80061-LIPID)  Problem # 3:  INSOMNIA (ICD-780.52) Assessment:  Unchanged 30 pills lasted pt 3+ months.  will refill meds. Her updated medication list for this problem includes:    Ambien 5 Mg Tabs (Zolpidem tartrate) ..... One tablet at bedtime as needed for insomnia  Complete Medication List: 1)  Xyzal 5 Mg Tabs (Levocetirizine dihydrochloride) .... Take one tablet daily 2)  Norvasc 5 Mg Tabs (Amlodipine besylate) .Marland Kitchen.. 1 by mouth qd 3)  Hydrochlorothiazide 25 Mg Tabs (Hydrochlorothiazide) .... Take 1/2  tablet by mouth every morning 4)  Synthroid 75 Mcg Tabs (Levothyroxine sodium) .... Take one tablet daily 5)  Calcium 600/vitamin D 600-400 Mg-unit Chew (Calcium carbonate-vitamin d) .Marland Kitchen.. 1 by mouth three times a day 6)  Levbid 0.375 Mg Xr12h-tab (Hyoscyamine sulfate) .... Take 1 tablet by mouth two times a day 7)  Simvastatin 40 Mg Tabs (Simvastatin) .Marland Kitchen.. 1 by mouth at bedtime 8)  Ambien 5 Mg Tabs (Zolpidem tartrate) .... One tablet at bedtime as needed for insomnia  Patient Instructions: 1)  Please schedule a follow-up appointment in 6 months for your complete physical. 2)  We'll notify you of your lab work 3)  Keep up the good work on diet and exercise- you look fantastic! 4)  Call if you need any medications 5)  Happy Easter!!  Prescriptions: SIMVASTATIN 40 MG TABS (SIMVASTATIN) 1 by mouth at bedtime  #30 x 6   Entered and Authorized by:   Neena Rhymes MD   Signed by:   Neena Rhymes MD on 02/16/2010   Method used:   Print then Give to Patient   RxID:   [id number] AMBIEN 5 MG TABS (ZOLPIDEM TARTRATE) one tablet at bedtime as needed for insomnia  #30 x 0   Entered and Authorized by:   Neena Rhymes MD   Signed by:   Neena Rhymes MD on 02/16/2010   Method used:   Print then Give to Patient   RxID:   [id number]

## 2010-12-13 NOTE — Progress Notes (Signed)
Summary: levothyroid and hctz   Phone Note Refill Request Message from:  Fax from Pharmacy on November 14, 2009 12:04 PM  Refills Requested: Medication #1:  HYDROCHLOROTHIAZIDE 25 MG TABS Take 1/2  tablet by mouth every morning  Medication #2:  SYNTHROID 75 MCG TABS take one tablet daily Initial call taken by: Doristine Devoid,  November 14, 2009 12:05 PM    Prescriptions: SYNTHROID 75 MCG TABS (LEVOTHYROXINE SODIUM) take one tablet daily  #90 x 0   Entered by:   Doristine Devoid   Authorized by:   Neena Rhymes MD   Signed by:   Doristine Devoid on 11/14/2009   Method used:   Electronically to        [facility] (mail-order)             ,          Ph: [phone]       Fax: [phone]   RxID:   [id number] HYDROCHLOROTHIAZIDE 25 MG TABS (HYDROCHLOROTHIAZIDE) Take 1/2  tablet by mouth every morning  #90 x 0   Entered by:   Doristine Devoid   Authorized by:   Neena Rhymes MD   Signed by:   Doristine Devoid on 11/14/2009   Method used:   Electronically to        [facility]* (mail-order)             ,          Ph: [phone]       Fax: [phone]   RxID:   [id number]

## 2010-12-13 NOTE — Progress Notes (Signed)
Summary: amlodipine   Phone Note Refill Request Message from:  Patient on February 20, 2010 8:36 AM  Refills Requested: Medication #1:  NORVASC 5 MG  TABS 1 by mouth qd Initial call taken by: Doristine Devoid,  February 20, 2010 8:36 AM    Prescriptions: NORVASC 5 MG  TABS (AMLODIPINE BESYLATE) 1 by mouth qd  #90 x 0   Entered by:   Doristine Devoid   Authorized by:   Neena Rhymes MD   Signed by:   Doristine Devoid on 02/20/2010   Method used:   Electronically to        [facility]  [address] [facility]* (retail)       [address]       [place], [place]  [address]       Ph: [phone] or [phone]       Fax: [phone]   RxID:   [id number]

## 2010-12-13 NOTE — Assessment & Plan Note (Signed)
Summary: hand swollen/pain/cbs   Vital Signs:  Patient profile:   60 year old female Weight:      113 pounds Pulse rate:   64 / minute BP sitting:   104 / 70  (left arm)  Vitals Entered By: Doristine Devoid (Mar 14, 2010 2:01 PM) CC: R hand swollen and painful x2 weeks    History of Present Illness: 60 yo woman here today w/ R hand swelling and pain.  swelling for 6 weeks.  no reported trauma.  no erythema or warmth.  pain described as a throbbing.  motrin w/out relief.  hx of ganglion cyst in wrist.  pain is not in joints- not on either knuckle.  Current Medications (verified): 1)  Xyzal 5 Mg  Tabs (Levocetirizine Dihydrochloride) .... Take One Tablet Daily 2)  Norvasc 5 Mg  Tabs (Amlodipine Besylate) .Marland Kitchen.. 1 By Mouth Qd 3)  Hydrochlorothiazide 25 Mg Tabs (Hydrochlorothiazide) .... Take 1/2  Tablet By Mouth Every Morning 4)  Synthroid 75 Mcg Tabs (Levothyroxine Sodium) .... Take One Tablet Daily 5)  Calcium 600/vitamin D 600-400 Mg-Unit  Chew (Calcium Carbonate-Vitamin D) .Marland Kitchen.. 1 By Mouth Three Times A Day 6)  Levbid 0.375 Mg Xr12h-Tab (Hyoscyamine Sulfate) .... Take 1 Tablet By Mouth Two Times A Day 7)  Simvastatin 40 Mg Tabs (Simvastatin) .Marland Kitchen.. 1 By Mouth At Bedtime 8)  Ambien 5 Mg Tabs (Zolpidem Tartrate) .... One Tablet At Bedtime As Needed For Insomnia  Allergies (verified): 1)  ! Penicillin 2)  ! Sulfa 3)  ! Tetracycline  Review of Systems      See HPI  Physical Exam  General:  Well-developed,well-nourished,in no acute distress; alert,appropriate and cooperative throughout examination Msk:  no TTP over knuckles, no erythema or warmth of joints.  + swelling between knuckles, no TTP but pain w/ finger flexion. Pulses:  +2 ulnar and radial pulses   Impression & Recommendations:  Problem # 1:  HAND PAIN, RIGHT (ICD-729.5) Assessment New pt's pain is not true joint pain.  pain is actually in the space between the knuckles.  ? presence of a cyst.  considered xray of hand but  will defer to hand specialist to choose imaging of choice.  would be very odd gout presentation b/c it is not in a joint.  no hx of trauma.  continue NSAIDs and ice area.  refer to hand specialist. Orders: Orthopedic Referral (Ortho)  Complete Medication List: 1)  Xyzal 5 Mg Tabs (Levocetirizine dihydrochloride) .... Take one tablet daily 2)  Norvasc 5 Mg Tabs (Amlodipine besylate) .Marland Kitchen.. 1 by mouth qd 3)  Hydrochlorothiazide 25 Mg Tabs (Hydrochlorothiazide) .... Take 1/2  tablet by mouth every morning 4)  Synthroid 75 Mcg Tabs (Levothyroxine sodium) .... Take one tablet daily 5)  Calcium 600/vitamin D 600-400 Mg-unit Chew (Calcium carbonate-vitamin d) .Marland Kitchen.. 1 by mouth three times a day 6)  Levbid 0.375 Mg Xr12h-tab (Hyoscyamine sulfate) .... Take 1 tablet by mouth two times a day 7)  Simvastatin 40 Mg Tabs (Simvastatin) .Marland Kitchen.. 1 by mouth at bedtime 8)  Ambien 5 Mg Tabs (Zolpidem tartrate) .... One tablet at bedtime as needed for insomnia  Patient Instructions: 1)  Someone will call you with your hand appt 2)  Ice the area at least two times a day 3)  Continue the motrin/ibuprofen every 6-8 hrs- take w/ food 4)  Continue to use your hand as regularly as possible 5)  HAPPY MOTHER'S DAY!

## 2010-12-13 NOTE — Progress Notes (Signed)
Summary: reaction to med  Phone Note Refill Request Call back at [phone] Message from:  Patient  Refills Requested: Medication #1:  LEVBID 0.375 MG XR12H-TAB Take 1 tablet by mouth two times a day. MUST HAVE OFFICE VISIT FOR FURTHER REFILLS!  Medication #2:  LIPITOR 20 MG TABS Take 1 tab at bedtime  Medication #3:  zyrtec 10mg  tab Pt states that last night she took the following med prior to bed. Pt awoke this AM with eyes almost swollen shut, and itching on certain body parts. Pt symptoms have resolve  some but eyelids are still swollen and itching. Pt has taken med together on several occasion but this is the first time she has had a reaction. Pls advise..............Marland KitchenFelecia Deloach CMA  October 23, 2010 10:45 AM    Follow-up for Phone Call        if pt has taken these meds together previously it is unlikely that they caused a reaction all of a sudden (but it is posible).  with the eyes swollen shut it would be more likely that she used a new soap, face lotion, makeup/remover that she was allergic to.  please ask her about this.  either way, she should take benadryl and apply cool compress/ice pack to eyes Follow-up by: Neena Rhymes MD,  October 23, 2010 10:55 AM  Additional Follow-up for Phone Call Additional follow up Details #1::        Pt aware and notes that she has not had any changes in products that she uses. Pt will try the following and if no relief will call for OV but if symptoms worsen will go to ED for evaluation............Marland KitchenFelecia Deloach CMA  October 23, 2010 11:11 AM

## 2010-12-13 NOTE — Letter (Signed)
Summary: [facility]  [facility]   Imported By: Lanelle Bal 05/21/2010 13:10:30  _____________________________________________________________________  External Attachment:    Type:   Image     Comment:   External Document

## 2010-12-14 ENCOUNTER — Telehealth (INDEPENDENT_AMBULATORY_CARE_PROVIDER_SITE_OTHER): Payer: Self-pay | Admitting: *Deleted

## 2010-12-19 NOTE — Progress Notes (Signed)
Summary: Refill  Phone Note Refill Request Message from:  Pharmacy on December 14, 2010 12:09 PM  Refills Requested: Medication #1:  LIPITOR 20 MG TABS Take 1 tab at bedtime   Dosage confirmed as above?Dosage Confirmed   Supply Requested: 1 month [facility] [address]   Next Appointment Scheduled: none Initial call taken by: Lavell Islam,  December 14, 2010 12:10 PM    Prescriptions: LIPITOR 20 MG TABS (ATORVASTATIN CALCIUM) Take 1 tab at bedtime  #30 x 2   Entered by:   Doristine Devoid CMA   Authorized by:   Neena Rhymes MD   Signed by:   Doristine Devoid CMA on 12/14/2010   Method used:   Electronically to        [id number] (retail)       [address]       [place], [place]  [address]       Ph: [phone] or [phone]       Fax: [phone]   RxID:   [id number]

## 2011-02-22 ENCOUNTER — Encounter: Payer: Self-pay | Admitting: Family Medicine

## 2011-02-28 ENCOUNTER — Encounter: Payer: Self-pay | Admitting: Family Medicine

## 2011-02-28 ENCOUNTER — Ambulatory Visit (INDEPENDENT_AMBULATORY_CARE_PROVIDER_SITE_OTHER): Payer: 59 | Admitting: Family Medicine

## 2011-02-28 DIAGNOSIS — I10 Essential (primary) hypertension: Secondary | ICD-10-CM

## 2011-02-28 DIAGNOSIS — E785 Hyperlipidemia, unspecified: Secondary | ICD-10-CM

## 2011-02-28 DIAGNOSIS — E039 Hypothyroidism, unspecified: Secondary | ICD-10-CM

## 2011-02-28 LAB — BASIC METABOLIC PANEL
CO2: 32 mEq/L (ref 19–32)
Calcium: 10.2 mg/dL (ref 8.4–10.5)
Creatinine, Ser: 0.7 mg/dL (ref 0.4–1.2)
Glucose, Bld: 77 mg/dL (ref 70–99)

## 2011-02-28 LAB — HEPATIC FUNCTION PANEL
AST: 17 U/L (ref 0–37)
Alkaline Phosphatase: 62 U/L (ref 39–117)
Bilirubin, Direct: 0.1 mg/dL (ref 0.0–0.3)
Total Bilirubin: 0.7 mg/dL (ref 0.3–1.2)

## 2011-02-28 LAB — LIPID PANEL: Total CHOL/HDL Ratio: 3

## 2011-02-28 MED ORDER — HYOSCYAMINE SULFATE 0.375 MG PO TB12: 0.3750 mg | ORAL_TABLET | Freq: Two times a day (BID) | ORAL | Status: DC

## 2011-02-28 NOTE — Progress Notes (Signed)
  Subjective:    Patient ID: Cindy Blankenship, female    DOB: [birth year], 60 y.o.   MRN: [id number]  HPI HTN- chronic problem for pt, well controlled.  On HCTZ, norvasc daily.  No CP, SOB, HAs, visual changes, edema.  + fatigue.  Hypothyroid- has had increased fatigue recently.  Under a lot of stress but could be thyroid related.  Hyperlipidemia- chronic problem, well controlled on lipitor.  No abd pain, N/V, myalgias.   Review of Systems For ROS see HPI     Objective:   Physical Exam  Constitutional: She is oriented to person, place, and time. She appears well-developed and well-nourished. No distress.  HENT:  Head: Normocephalic and atraumatic.  Eyes: Conjunctivae and EOM are normal. Pupils are equal, round, and reactive to light.  Neck: Normal range of motion. Neck supple. No thyromegaly present.  Cardiovascular: Normal rate, regular rhythm, normal heart sounds and intact distal pulses.   No murmur heard. Pulmonary/Chest: Effort normal and breath sounds normal. No respiratory distress.  Abdominal: Soft. She exhibits no distension. There is no tenderness.  Musculoskeletal: She exhibits no edema.  Lymphadenopathy:    She has no cervical adenopathy.  Neurological: She is alert and oriented to person, place, and time.  Skin: Skin is warm and dry.  Psychiatric: She has a normal mood and affect. Her behavior is normal.          Assessment & Plan:

## 2011-02-28 NOTE — Assessment & Plan Note (Signed)
Pt's BP well controlled despite recent stressors.  Check labs.  No med changes.

## 2011-02-28 NOTE — Assessment & Plan Note (Signed)
TSH was good at last check but given pt's recent fatigue will check level.  Adjust meds prn.

## 2011-02-28 NOTE — Assessment & Plan Note (Signed)
Tolerating statin w/out difficulty.  Check labs.  Make med adjustments prn.

## 2011-02-28 NOTE — Patient Instructions (Signed)
If you are finding that the stress is getting overwhelming- please call me! We'll notify you of your lab results Call with any questions or concerns Hang in there!!!

## 2011-03-04 ENCOUNTER — Encounter: Payer: Self-pay | Admitting: *Deleted

## 2011-03-05 ENCOUNTER — Other Ambulatory Visit: Payer: Self-pay | Admitting: Family Medicine

## 2011-03-06 NOTE — Telephone Encounter (Signed)
Refill sent.

## 2011-03-13 ENCOUNTER — Other Ambulatory Visit: Payer: Self-pay | Admitting: Family Medicine

## 2011-03-29 NOTE — Assessment & Plan Note (Signed)
[facility]                         GASTROENTEROLOGY OFFICE NOTE   Cindy, Blankenship                       MRN:          [id number]  DATE:10/29/2006                            DOB:          01-Apr-1951    Ms. Cindy Blankenship is a very nice 60 year old white female who is here today  for evaluation of left lower quadrant abdominal discomfort and irregular  bowel habits.  We saw her in May 2003 for similar symptoms as well as  for neoplastic screening.  Her colonoscopy in May 2003 showed internal  hemorrhoids, otherwise normal colon to the cecum.  Her mother had colon  cancer, one sister has colon polyps, and one sister Crohn's disease.  She herself had endometriosis in 1996 and underwent total abdominal  hysterectomy and salpingo-oophorectomy.  For the past year or so and  mostly for the past 2 months she has had crampy abdominal pain, frequent  bowel movements with urgency which occur on some days alternating with  days without a bowel movement, being constipated.  She has tried some  Metamucil and Citrucel without much improvement.  She also has had  flatulence and bloating.  She denies excessive stress.  Her eating  habits have been regular, rarely skipping a meal.  She tries to  incorporate fiber.   MEDICATIONS:  1. Xyzal 5 mg p.o. daily.  2. Norvasc 5 mg p.o. daily.  3. Synthroid 0.05 mg p.o. daily.  4. Zocor 40 mg p.o. daily.  5. Hydrochlorothiazide 25 mg one-half tablet daily.   PHYSICAL EXAMINATION:  VITAL SIGNS:  Blood pressure 110/60, pulse 80,  and weight 116 pounds, which represents a 5-pound weight gain since last  exam.  GENERAL:  She was alert and oriented in no distress.  NECK:  Supple, no adenopathy.  HEENT:  Oral cavity was normal.  LUNGS:  Clear to auscultation.  CARDIAC:  Normal S1, normal S2.  ABDOMEN:  Soft with diffuse tenderness mostly in the right lower  quadrant as well as in the left lower quadrant.  Palpation of the  right  lower quadrant exacerbated pain in the left lower quadrant.  There was  no palpable mass and no distention.  Bowel sounds were normal active.  There was no rebound.  Epigastrium, liver and right upper quadrant were  normal.  RECTAL:  Normal rectal tone.  Stool was heme negative.   IMPRESSION:  A 60 year old white female with change in the bowel habits  and strong family history of colon cancer.  She is hemoccult negative.  Her symptoms are suggestive for irritable bowel syndrome but we need to  rule out inflammatory bowel disease as well since her sister has Crohn's  disease.  Her last colonoscopy 5 years ago did not show any  diverticulosis but the diverticular disease would be in the differential  diagnosis at this time.   PLAN:  1. Start Levbid 0.375 mg one tablet to one-half tablet q.a.m.  2. High fiber diet with supplements of Benefiber on a daily basis.  3. Colonoscopy has been scheduled using routine colonoscopy prep.     Cindy Blankenship. Cindy Chance, MD  Electronically Signed    DMB/MedQ  DD: 10/29/2006  DT: 10/29/2006  Job #: [id number]   cc:   Leanne Chang, M.D.

## 2011-05-15 ENCOUNTER — Other Ambulatory Visit: Payer: Self-pay | Admitting: Family Medicine

## 2011-05-16 NOTE — Telephone Encounter (Signed)
Last refilled 08/15/10 at time of physical.

## 2011-05-16 NOTE — Telephone Encounter (Signed)
Hard copy faxed. 

## 2011-07-16 ENCOUNTER — Other Ambulatory Visit: Payer: Self-pay | Admitting: Family Medicine

## 2011-08-13 ENCOUNTER — Other Ambulatory Visit: Payer: Self-pay | Admitting: Family Medicine

## 2011-08-21 ENCOUNTER — Other Ambulatory Visit: Payer: Self-pay | Admitting: Family Medicine

## 2011-09-14 ENCOUNTER — Other Ambulatory Visit: Payer: Self-pay | Admitting: Family Medicine

## 2011-09-16 ENCOUNTER — Other Ambulatory Visit: Payer: Self-pay | Admitting: Family Medicine

## 2011-09-26 ENCOUNTER — Encounter: Payer: Self-pay | Admitting: Gynecology

## 2011-10-08 ENCOUNTER — Encounter: Payer: Self-pay | Admitting: *Deleted

## 2011-10-08 DIAGNOSIS — M8580 Other specified disorders of bone density and structure, unspecified site: Secondary | ICD-10-CM | POA: Insufficient documentation

## 2011-10-08 DIAGNOSIS — N893 Dysplasia of vagina, unspecified: Secondary | ICD-10-CM | POA: Insufficient documentation

## 2011-10-08 DIAGNOSIS — E785 Hyperlipidemia, unspecified: Secondary | ICD-10-CM | POA: Insufficient documentation

## 2011-10-08 DIAGNOSIS — K589 Irritable bowel syndrome without diarrhea: Secondary | ICD-10-CM | POA: Insufficient documentation

## 2011-10-10 ENCOUNTER — Ambulatory Visit (INDEPENDENT_AMBULATORY_CARE_PROVIDER_SITE_OTHER): Payer: 59 | Admitting: Gynecology

## 2011-10-10 ENCOUNTER — Encounter: Payer: Self-pay | Admitting: Gynecology

## 2011-10-10 VITALS — BP 120/68 | Ht 60.25 in | Wt 111.0 lb

## 2011-10-10 DIAGNOSIS — N816 Rectocele: Secondary | ICD-10-CM

## 2011-10-10 DIAGNOSIS — Z01419 Encounter for gynecological examination (general) (routine) without abnormal findings: Secondary | ICD-10-CM

## 2011-10-10 DIAGNOSIS — Z79890 Hormone replacement therapy (postmenopausal): Secondary | ICD-10-CM

## 2011-10-10 DIAGNOSIS — N8111 Cystocele, midline: Secondary | ICD-10-CM

## 2011-10-10 MED ORDER — ESTRADIOL 0.1 MG/24HR TD PTTW: 1.0000 | MEDICATED_PATCH | TRANSDERMAL | Status: DC

## 2011-10-10 NOTE — Progress Notes (Signed)
Cindy Blankenship 03-20-1951 [id number]        60 y.o.  for annual exam.  Knows that her cystocele symptoms seem to be getting worse or she is feeling more pressure on a daily basis. On ERT a Vivelle 0.1 mg patches doing well with these.  Past medical history,surgical history, medications, allergies, family history and social history were all reviewed and documented in the EPIC chart. ROS:  Was performed and pertinent positives and negatives are included in the history.  Exam: chaperone present Filed Vitals:   10/10/11 1050  BP: 120/68   General appearance  Normal Skin grossly normal Head/Neck normal with no cervical or supraclavicular adenopathy thyroid normal Lungs  clear Cardiac RR, without RMG Abdominal  soft, nontender, without masses, organomegaly or hernia Breasts  examined lying and sitting without masses, retractions, discharge or axillary adenopathy. Pelvic  Ext/BUS/vagina  Second-degree cystocele with straining. First-degree rectocele.  Cuff well supported  Adnexa  Without masses or tenderness    Anus and perineum  normal   Rectovaginal  normal sphincter tone without palpated masses or tenderness.    Assessment/Plan:  60 y.o. female for annual exam.    1. Second-degree cystocele first-degree rectocele. Patient is becoming more symptomatic. I reviewed Kegel exercises although not sure this is to help given the degree of her cystocele. Also discussed use of pessary. Lastly we reviewed surgery with a think is the best solution for her to include an anterior-posterior colporrhaphy. The cuff well supported. The issues of mesh versus traditional repair were reviewed. She wants to think of her options and she will call us with her decision. 2. ERT. I again discussed the issues of ERT, WHI study, increased risk of stroke heart attack DVT as well as possible increased risk of breast cancer. ACOG and NAMS statements for lowest dose for shortness period of time reviewed.  She is comfortable  with ERT wants to continue and I refilled her Vivelle 0.1 mg.patch x1 year. 3. Osteopenia.  DEXA December 2011 showed -1.7 T score. FRAX 10 year probability at 7.4% major and 0.8% hip fracture risk. Increase calcium vitamin D reviewed we'll plan on repeating her DEXA next year in follow up. 4. Colonoscopy. Patient is due for colonoscopy next year and she will schedule this. 5. Breast health SBE monthly reviewed. She had her mammogram to 3 weeks ago at [facility] reportedly normal. 6. Pap smear. 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 I raised you current screening guidelines and a Pap was done today and we'll plan on every three-year Pap smear at this point and she is comfortable with this. 7. Health maintenance. No blood work was done today as is all done through the primary's office he follows her for her medical conditions. The patient will follow up with [place] with her decision as far as the cystocele is concerned otherwise she will see [place] in a year.     Dara Lords MD, 11:31 AM 10/10/2011

## 2011-10-10 NOTE — Patient Instructions (Signed)
Follow up with decision about cystocele/rectocele repair. Otherwise follow up annually for routine GYN check up.

## 2011-10-11 ENCOUNTER — Telehealth: Payer: Self-pay | Admitting: *Deleted

## 2011-10-11 MED ORDER — ESTRADIOL 0.1 MG/24HR TD PTTW: 1.0000 | MEDICATED_PATCH | TRANSDERMAL | Status: DC

## 2011-10-11 NOTE — Telephone Encounter (Signed)
PHARMACY FAXED OVER OKAY TO GIVE PT 90 DAY SUPPLY OF VIVELLE-DOT 0.1 MG. 90 DAY SUPPLY SENT

## 2011-10-16 ENCOUNTER — Other Ambulatory Visit: Payer: Self-pay | Admitting: Family Medicine

## 2011-10-17 NOTE — Telephone Encounter (Signed)
rx sent to pharmacy by e-script  

## 2011-10-21 ENCOUNTER — Other Ambulatory Visit: Payer: Self-pay | Admitting: Family Medicine

## 2011-10-22 ENCOUNTER — Telehealth: Payer: Self-pay | Admitting: *Deleted

## 2011-10-22 NOTE — Telephone Encounter (Signed)
.  left message to have patient return my call. To set up an appt for back pain

## 2011-10-22 NOTE — Telephone Encounter (Signed)
rx sent to pharmacy by e-script Only sent #30 per pt needs to set up appt per overdue for OV

## 2011-10-22 NOTE — Telephone Encounter (Signed)
If pt has not seen Dr Beverely Low for back pain she needs to be seen to get pain medication.

## 2011-10-22 NOTE — Telephone Encounter (Signed)
Pt called in to ask for something for back pain. Last OV 02-28-11

## 2011-10-28 NOTE — Telephone Encounter (Signed)
Several attempts made to contact pt, no response from pt

## 2011-10-29 ENCOUNTER — Ambulatory Visit (INDEPENDENT_AMBULATORY_CARE_PROVIDER_SITE_OTHER): Payer: 59 | Admitting: Family Medicine

## 2011-10-29 ENCOUNTER — Encounter: Payer: Self-pay | Admitting: Family Medicine

## 2011-10-29 VITALS — BP 130/80 | HR 78 | Temp 97.8°F | Wt 111.0 lb

## 2011-10-29 DIAGNOSIS — M549 Dorsalgia, unspecified: Secondary | ICD-10-CM

## 2011-10-29 MED ORDER — CYCLOBENZAPRINE HCL 10 MG PO TABS: 10.0000 mg | ORAL_TABLET | Freq: Three times a day (TID) | ORAL | Status: AC | PRN

## 2011-10-29 MED ORDER — HYDROCODONE-ACETAMINOPHEN 7.5-750 MG PO TABS: 1.0000 | ORAL_TABLET | Freq: Three times a day (TID) | ORAL | Status: AC | PRN

## 2011-10-29 NOTE — Patient Instructions (Signed)
Back Pain, Adult Low back pain is very common. About 1 in 5 people have back pain.The cause of low back pain is rarely dangerous. The pain often gets better over time.About half of people with a sudden onset of back pain feel better in just 2 weeks. About 8 in 10 people feel better by 6 weeks.  CAUSES Some common causes of back pain include:  Strain of the muscles or ligaments supporting the spine.   Wear and tear (degeneration) of the spinal discs.   Arthritis.   Direct injury to the back.  DIAGNOSIS Most of the time, the direct cause of low back pain is not known.However, back pain can be treated effectively even when the exact cause of the pain is unknown.Answering your caregiver's questions about your overall health and symptoms is one of the most accurate ways to make sure the cause of your pain is not dangerous. If your caregiver needs more information, he or she may order lab work or imaging tests (X-rays or MRIs).However, even if imaging tests show changes in your back, this usually does not require surgery. HOME CARE INSTRUCTIONS For many people, back pain returns.Since low back pain is rarely dangerous, it is often a condition that people can learn to manageon their own.   Remain active. It is stressful on the back to sit or stand in one place. Do not sit, drive, or stand in one place for more than 30 minutes at a time. Take short walks on level surfaces as soon as pain allows.Try to increase the length of time you walk each day.   Do not stay in bed.Resting more than 1 or 2 days can delay your recovery.   Do not avoid exercise or work.Your body is made to move.It is not dangerous to be active, even though your back may hurt.Your back will likely heal faster if you return to being active before your pain is gone.   Pay attention to your body when you bend and lift. Many people have less discomfortwhen lifting if they bend their knees, keep the load close to their  bodies,and avoid twisting. Often, the most comfortable positions are those that put less stress on your recovering back.   Find a comfortable position to sleep. Use a firm mattress and lie on your side with your knees slightly bent. If you lie on your back, put a pillow under your knees.   Only take over-the-counter or prescription medicines as directed by your caregiver. Over-the-counter medicines to reduce pain and inflammation are often the most helpful.Your caregiver may prescribe muscle relaxant drugs.These medicines help dull your pain so you can more quickly return to your normal activities and healthy exercise.   Put ice on the injured area.   Put ice in a plastic bag.   Place a towel between your skin and the bag.   Leave the ice on for 15 to 20 minutes, 3 to 4 times a day for the first 2 to 3 days. After that, ice and heat may be alternated to reduce pain and spasms.   Ask your caregiver about trying back exercises and gentle massage. This may be of some benefit.   Avoid feeling anxious or stressed.Stress increases muscle tension and can worsen back pain.It is important to recognize when you are anxious or stressed and learn ways to manage it.Exercise is a great option.  SEEK MEDICAL CARE IF:  You have pain that is not relieved with rest or medicine.   You have   pain that does not improve in 1 week.   You have new symptoms.   You are generally not feeling well.  SEEK IMMEDIATE MEDICAL CARE IF:   You have pain that radiates from your back into your legs.   You develop new bowel or bladder control problems.   You have unusual weakness or numbness in your arms or legs.   You develop nausea or vomiting.   You develop abdominal pain.   You feel faint.  Document Released: 10/28/2005 Document Revised: 07/10/2011 Document Reviewed: 03/18/2011 Exit[employer] Patient Information 2012 [employer]. 

## 2011-10-29 NOTE — Progress Notes (Signed)
  Subjective:    Cindy Blankenship is a 60 y.o. female who presents for follow up of low back problems. Current symptoms include: pain in L low back (stabbing in character; 8/10 in severity). Symptoms have not changed from the previous visit. Exacerbating factors identified by the patient are bending backwards, bending forwards, bending sideways, sitting, standing and walking.  The following portions of the patient's history were reviewed and updated as appropriate: allergies, current medications, past family history, past medical history, past social history, past surgical history and problem list.    Objective:    BP 130/80  Pulse 78  Temp(Src) 97.8 F (36.6 C) (Oral)  Wt 111 lb (50.349 kg)  SpO2 97%  LMP 11/11/1984 General appearance: alert, cooperative, appears stated age and no distress Extremities: extremities normal, atraumatic, no cyanosis or edema Pulses: 2+ and symmetric Neurologic: Alert and oriented X 3, normal strength and tone. Normal symmetric reflexes. Normal coordination and gait    Assessment:    Nonspecific acute low back pain    Plan:    Educational material distributed. Regular aerobic and trunk strengthening exercises discussed. Short (2-4 day) period of relative rest recommended until acute symptoms improve. Ice to affected area as needed for local pain relief. Heat to affected area as needed for local pain relief. Muscle relaxants per medication orders. Follow-up in 2 weeks. --if no better

## 2011-11-15 ENCOUNTER — Other Ambulatory Visit: Payer: Self-pay | Admitting: Family Medicine

## 2011-11-15 MED ORDER — ATORVASTATIN CALCIUM 20 MG PO TABS: 20.0000 mg | ORAL_TABLET | Freq: Every day | ORAL | Status: DC

## 2011-11-15 NOTE — Telephone Encounter (Signed)
rx sent to pharmacy by e-script  

## 2011-12-02 ENCOUNTER — Other Ambulatory Visit: Payer: Self-pay | Admitting: Family Medicine

## 2011-12-02 MED ORDER — HYDROCHLOROTHIAZIDE 25 MG PO TABS: 25.0000 mg | ORAL_TABLET | Freq: Every day | ORAL | Status: DC

## 2011-12-02 NOTE — Telephone Encounter (Signed)
rx sent to pharmacy by e-script for #30 no refills  noted pt has not had labs drawn since 02-28-11, sent letter to advise to schedule appt

## 2011-12-05 ENCOUNTER — Other Ambulatory Visit: Payer: Self-pay | Admitting: Family Medicine

## 2011-12-05 MED ORDER — ZOLPIDEM TARTRATE 5 MG PO TABS: 5.0000 mg | ORAL_TABLET | Freq: Every evening | ORAL | Status: DC | PRN

## 2011-12-05 NOTE — Telephone Encounter (Signed)
Ok for #30, 1 refill 

## 2011-12-05 NOTE — Telephone Encounter (Signed)
Last OV 02-28-11 with you, with lowne for back pain on 10-29-11 last refill 05-15-11 #30 with 1 refill

## 2011-12-05 NOTE — Telephone Encounter (Signed)
.  rx faxed to pharmacy, manually.  

## 2011-12-24 ENCOUNTER — Encounter: Payer: Self-pay | Admitting: Internal Medicine

## 2011-12-24 ENCOUNTER — Telehealth: Payer: Self-pay | Admitting: Family Medicine

## 2011-12-24 MED ORDER — ATORVASTATIN CALCIUM 20 MG PO TABS: 20.0000 mg | ORAL_TABLET | Freq: Every day | ORAL | Status: DC

## 2011-12-24 NOTE — Telephone Encounter (Signed)
Refill- atorvastatin 20mg  tablet. Take one tablet by mouth once daily. Qty 30 last fill 1.4.13

## 2011-12-24 NOTE — Telephone Encounter (Signed)
Pt noted to have upcoming apt 01-09-12 thus sent #30 no refills per pt overdue for labs.

## 2012-01-09 ENCOUNTER — Encounter: Payer: Self-pay | Admitting: Family Medicine

## 2012-01-09 ENCOUNTER — Ambulatory Visit (INDEPENDENT_AMBULATORY_CARE_PROVIDER_SITE_OTHER): Payer: 59 | Admitting: Family Medicine

## 2012-01-09 DIAGNOSIS — E039 Hypothyroidism, unspecified: Secondary | ICD-10-CM

## 2012-01-09 DIAGNOSIS — E785 Hyperlipidemia, unspecified: Secondary | ICD-10-CM

## 2012-01-09 DIAGNOSIS — Z0000 Encounter for general adult medical examination without abnormal findings: Secondary | ICD-10-CM

## 2012-01-09 DIAGNOSIS — I10 Essential (primary) hypertension: Secondary | ICD-10-CM

## 2012-01-09 LAB — CBC WITH DIFFERENTIAL/PLATELET
Basophils Absolute: 0 10*3/uL (ref 0.0–0.1)
Basophils Relative: 0.5 % (ref 0.0–3.0)
Eosinophils Absolute: 0.2 10*3/uL (ref 0.0–0.7)
Hemoglobin: 13.9 g/dL (ref 12.0–15.0)
MCHC: 34 g/dL (ref 30.0–36.0)
MCV: 86.3 fl (ref 78.0–100.0)
Monocytes Absolute: 0.2 10*3/uL (ref 0.1–1.0)
Neutro Abs: 1.9 10*3/uL (ref 1.4–7.7)
Neutrophils Relative %: 53.2 % (ref 43.0–77.0)
RBC: 4.75 Mil/uL (ref 3.87–5.11)
RDW: 12.8 % (ref 11.5–14.6)

## 2012-01-09 LAB — BASIC METABOLIC PANEL
BUN: 14 mg/dL (ref 6–23)
Creatinine, Ser: 0.6 mg/dL (ref 0.4–1.2)
GFR: 112.99 mL/min (ref >60.00)

## 2012-01-09 LAB — LIPID PANEL
Cholesterol: 161 mg/dL (ref 0–200)
HDL: 65.3 mg/dL (ref >39.00)
Triglycerides: 65 mg/dL (ref 0.0–149.0)
VLDL: 13 mg/dL (ref 0.0–40.0)

## 2012-01-09 LAB — HEPATIC FUNCTION PANEL: Total Bilirubin: 0.4 mg/dL (ref 0.3–1.2)

## 2012-01-09 LAB — TSH: TSH: 0.64 u[IU]/mL (ref 0.35–5.50)

## 2012-01-09 NOTE — Patient Instructions (Signed)
Follow up in 6 months to recheck cholesterol and BP Keep up the good work!  You look great! We'll notify you of your lab results Call with any questions or concerns Happy Spring!!!

## 2012-01-09 NOTE — Assessment & Plan Note (Signed)
Chronic problem.  Check labs.  Adjust meds prn  

## 2012-01-09 NOTE — Progress Notes (Signed)
  Subjective:    Patient ID: Cindy Blankenship, female    DOB: 06-Dec-1950, 61 y.o.   MRN: [id number]  HPI CPE- UTD on GYN (Dr Audie Box), colonoscopy 2008.  No concerns today.   Review of Systems Patient reports no vision/ hearing changes, adenopathy,fever, weight change,  persistant/recurrent hoarseness , swallowing issues, chest pain, palpitations, edema, persistant/recurrent cough, hemoptysis, dyspnea (rest/exertional/paroxysmal nocturnal), gastrointestinal bleeding (melena, rectal bleeding), abdominal pain, significant heartburn, bowel changes, GU symptoms (dysuria, hematuria, incontinence), Gyn symptoms (abnormal  bleeding, pain),  syncope, focal weakness, memory loss, numbness & tingling, skin/hair/nail changes, abnormal bruising or bleeding, anxiety, or depression.     Objective:   Physical Exam General Appearance:    Alert, cooperative, no distress, appears stated age  Head:    Normocephalic, without obvious abnormality, atraumatic  Eyes:    PERRL, conjunctiva/corneas clear, EOM's intact, fundi    benign, both eyes  Ears:    Normal TM's and external ear canals, both ears  Nose:   Nares normal, septum midline, mucosa normal, no drainage    or sinus tenderness  Throat:   Lips, mucosa, and tongue normal; teeth and gums normal  Neck:   Supple, symmetrical, trachea midline, no adenopathy;    Thyroid: no enlargement/tenderness/nodules  Back:     Symmetric, no curvature, ROM normal, no CVA tenderness  Lungs:     Clear to auscultation bilaterally, respirations unlabored  Chest Wall:    No tenderness or deformity   Heart:    Regular rate and rhythm, S1 and S2 normal, no murmur, rub   or gallop  Breast Exam:    Deferred to GYN  Abdomen:     Soft, non-tender, bowel sounds active all four quadrants,    no masses, no organomegaly  Genitalia:    Deferred to GYN  Rectal:    Extremities:   Extremities normal, atraumatic, no cyanosis or edema  Pulses:   2+ and symmetric all extremities  Skin:    Skin color, texture, turgor normal, no rashes or lesions  Lymph nodes:   Cervical, supraclavicular, and axillary nodes normal  Neurologic:   CNII-XII intact, normal strength, sensation and reflexes    throughout          Assessment & Plan:

## 2012-01-09 NOTE — Assessment & Plan Note (Signed)
Pt's PE WNL.  UTD on GYN, colonoscopy.  Check labs.  Anticipatory guidance provided.  

## 2012-01-09 NOTE — Assessment & Plan Note (Signed)
Excellent control.  Asymptomatic.  No changes. 

## 2012-01-13 ENCOUNTER — Encounter: Payer: Self-pay | Admitting: *Deleted

## 2012-01-20 ENCOUNTER — Telehealth: Payer: Self-pay | Admitting: Family Medicine

## 2012-01-20 MED ORDER — ATORVASTATIN CALCIUM 20 MG PO TABS: 20.0000 mg | ORAL_TABLET | Freq: Every day | ORAL | Status: DC

## 2012-01-20 NOTE — Telephone Encounter (Signed)
rx sent to pharmacy by e-script  

## 2012-01-20 NOTE — Telephone Encounter (Signed)
Atorvastatin (Tab) 20 MG  Take 1 tablet (20 mg total) by mouth daily  Qty 30  Last fill date 12/24/11

## 2012-01-23 ENCOUNTER — Other Ambulatory Visit (INDEPENDENT_AMBULATORY_CARE_PROVIDER_SITE_OTHER): Payer: 59

## 2012-01-23 ENCOUNTER — Encounter: Payer: Self-pay | Admitting: Internal Medicine

## 2012-01-23 DIAGNOSIS — E785 Hyperlipidemia, unspecified: Secondary | ICD-10-CM

## 2012-01-23 LAB — HEPATIC FUNCTION PANEL
AST: 26 U/L (ref 0–37)
Albumin: 4.3 g/dL (ref 3.5–5.2)
Alkaline Phosphatase: 69 U/L (ref 39–117)
Total Protein: 7.2 g/dL (ref 6.0–8.3)

## 2012-01-24 ENCOUNTER — Telehealth: Payer: Self-pay | Admitting: *Deleted

## 2012-01-24 NOTE — Telephone Encounter (Signed)
Pt called left msg on triage vmail requesting lab results.   Best # to contact is: [phone]

## 2012-01-24 NOTE — Telephone Encounter (Signed)
Called pt to advise lab results, left message on home vm stating that I will call her mobile, Spoke to pt to advise results/instructions. Pt understood.

## 2012-02-20 ENCOUNTER — Other Ambulatory Visit: Payer: Self-pay | Admitting: *Deleted

## 2012-02-20 MED ORDER — HYDROCHLOROTHIAZIDE 25 MG PO TABS: 25.0000 mg | ORAL_TABLET | Freq: Every day | ORAL | Status: DC

## 2012-02-20 NOTE — Telephone Encounter (Signed)
Rx sent 

## 2012-02-27 ENCOUNTER — Ambulatory Visit (AMBULATORY_SURGERY_CENTER): Payer: 59 | Admitting: *Deleted

## 2012-02-27 VITALS — Ht 60.0 in | Wt 111.7 lb

## 2012-02-27 DIAGNOSIS — Z800 Family history of malignant neoplasm of digestive organs: Secondary | ICD-10-CM

## 2012-02-27 DIAGNOSIS — Z1211 Encounter for screening for malignant neoplasm of colon: Secondary | ICD-10-CM

## 2012-02-27 MED ORDER — PEG-KCL-NACL-NASULF-NA ASC-C 100 G PO SOLR: ORAL | Status: DC

## 2012-02-28 ENCOUNTER — Encounter: Payer: Self-pay | Admitting: Internal Medicine

## 2012-03-12 ENCOUNTER — Ambulatory Visit (AMBULATORY_SURGERY_CENTER): Payer: 59 | Admitting: Internal Medicine

## 2012-03-12 ENCOUNTER — Encounter: Payer: Self-pay | Admitting: Internal Medicine

## 2012-03-12 VITALS — BP 127/79 | HR 88 | Temp 96.8°F | Resp 18 | Ht 60.0 in | Wt 111.0 lb

## 2012-03-12 DIAGNOSIS — Z800 Family history of malignant neoplasm of digestive organs: Secondary | ICD-10-CM

## 2012-03-12 DIAGNOSIS — Z1211 Encounter for screening for malignant neoplasm of colon: Secondary | ICD-10-CM

## 2012-03-12 MED ORDER — SODIUM CHLORIDE 0.9 % IV SOLN: 500.0000 mL | INTRAVENOUS | Status: DC

## 2012-03-12 NOTE — Progress Notes (Signed)
Marrion Coy, CRNA administered propofol to the pt throughout the procedure. Maw

## 2012-03-12 NOTE — Patient Instructions (Signed)

## 2012-03-12 NOTE — Op Note (Signed)
[facility] [address] [place], [place]  [address]  COLONOSCOPY PROCEDURE REPORT  PATIENT:  Cindy Blankenship, Cindy Blankenship  MR#:  [id number] BIRTHDATE:  Mar 11, 1951, 60 yrs. old  GENDER:  female ENDOSCOPIST:  [name]. Juanda Chance, MD REF. BY: PROCEDURE DATE:  03/12/2012 PROCEDURE:  Colonoscopy 29562 ASA CLASS:  Class I INDICATIONS:  family history of colon cancer mother with colon cancer prior colon 1996, 2003, 2008 MEDICATIONS:   MAC sedation, administered by CRNA, propofol (Diprivan) 220 mg  DESCRIPTION OF PROCEDURE:   After the risks and benefits and of the procedure were explained, informed consent was obtained. Digital rectal exam was performed and revealed no rectal masses. The LB [id number]-[id number] endoscope was introduced through the anus and advanced to the cecum, which was identified by both the appendix and ileocecal valve.  The quality of the prep was excellent, using MoviPrep.  The instrument was then slowly withdrawn as the colon was fully examined. <<PROCEDUREIMAGES>>  FINDINGS:  No polyps or cancers were seen (see image1, image2, and image3).   Retroflexed views in the rectum revealed no abnormalities.    The scope was then withdrawn from the patient and the procedure completed.  COMPLICATIONS:  None ENDOSCOPIC IMPRESSION: 1) No polyps or cancers 2) Normal colonoscopy RECOMMENDATIONS: 1) High fiber diet.  REPEAT EXAM:  In 5 year(s) for.  ______________________________ [name]. Juanda Chance, MD  CC:  n. eSIGNED:   [name]. Yahye Siebert at 03/12/2012 09:54 AM  Sharyn Lull, [phone]

## 2012-03-12 NOTE — Progress Notes (Signed)
Patient did not have preoperative order for IV antibiotic SSI prophylaxis. (G8918)  Patient did not experience any of the following events: a burn prior to discharge; a fall within the facility; wrong site/side/patient/procedure/implant event; or a hospital transfer or hospital admission upon discharge from the facility. (G8907)  

## 2012-03-12 NOTE — Progress Notes (Signed)
The pt tolerated the colonoscopy very well. Maw   

## 2012-03-13 ENCOUNTER — Telehealth: Payer: Self-pay | Admitting: *Deleted

## 2012-03-13 NOTE — Telephone Encounter (Signed)
  Follow up Call-  Call back number 03/12/2012  Post procedure Call Back phone  # [phone]  Permission to leave phone message Yes     Patient questions:  Do you have a fever, pain , or abdominal swelling? no Pain Score  0 *  Have you tolerated food without any problems? yes  Have you been able to return to your normal activities? yes  Do you have any questions about your discharge instructions: Diet   no Medications  no Follow up visit  no  Do you have questions or concerns about your Care? no  Actions: * If pain score is 4 or above: No action needed, pain <4. Pt states she is fine, did well and pt states she hopes she gets a survey because every one here was phenomenal. ewm

## 2012-04-10 ENCOUNTER — Telehealth: Payer: Self-pay | Admitting: Family Medicine

## 2012-04-10 MED ORDER — HYOSCYAMINE SULFATE ER 0.375 MG PO TB12: 0.3750 mg | ORAL_TABLET | Freq: Two times a day (BID) | ORAL | Status: DC | PRN

## 2012-04-10 NOTE — Telephone Encounter (Signed)
rx sent to pharmacy by e-script  

## 2012-04-10 NOTE — Telephone Encounter (Signed)
Refill: Hyocyamine er 0.375 mg tab. Take 1 tablet by mouth twice a day. Qty 60. Last fill 11-08-11

## 2012-04-13 ENCOUNTER — Telehealth: Payer: Self-pay | Admitting: Family Medicine

## 2012-04-13 NOTE — Telephone Encounter (Signed)
Refill: Zolpidem tartrate 5 mg tablet. Take 1 tablet by mouth at bedtime if needed for sleep. Last fill 02-17-12

## 2012-04-14 MED ORDER — ZOLPIDEM TARTRATE 5 MG PO TABS: 5.0000 mg | ORAL_TABLET | Freq: Every evening | ORAL | Status: DC | PRN

## 2012-04-14 NOTE — Telephone Encounter (Signed)
.  rx faxed to pharmacy, manually.  

## 2012-04-14 NOTE — Telephone Encounter (Signed)
Ok for #30, 3 refill

## 2012-04-14 NOTE — Telephone Encounter (Signed)
Last OV 01-09-12 last refill 12-05-11 #30 plus 1 refill

## 2012-04-17 ENCOUNTER — Telehealth: Payer: Self-pay | Admitting: Family Medicine

## 2012-04-17 MED ORDER — LEVOTHYROXINE SODIUM 75 MCG PO TABS: 75.0000 ug | ORAL_TABLET | Freq: Every day | ORAL | Status: DC

## 2012-04-17 NOTE — Telephone Encounter (Signed)
Refill: Synthroid tablet. Substitution allowed - patient requested product dispensed. Take 1 tablet by mouth once daily. Qty 30. Last fill 03-17-12

## 2012-04-17 NOTE — Telephone Encounter (Signed)
rx sent to pharmacy by e-script  

## 2012-05-22 ENCOUNTER — Telehealth: Payer: Self-pay | Admitting: Family Medicine

## 2012-05-22 NOTE — Telephone Encounter (Signed)
No need to adjust meds at this time- particularly if swelling is mild.  Likely due to heat and activity.  Try and elevate legs, wear good supportive shoes (sneakers) if able and if swelling continues, will need OV.

## 2012-05-22 NOTE — Telephone Encounter (Signed)
Caller: [place]/Patient; PCP: Sheliah Hatch.; CB#: [phone]; ; ; Call regarding Swelling;  Onset-weeks per pt. She c/o of swelling in her feet and ankles. It is mild per pt but noticable especially last night.  Emergent s/s of Edema Atraumatic protocol r/o. Pt to see provider within 24hrs. Offered to make pt an appt, states she is getting ready to go out of town. She wanted to know from doctor if she thinks any of her medications need to be adjusted, her  BP medicine  or fluid pill.

## 2012-05-22 NOTE — Telephone Encounter (Signed)
Spoke to pt to advise results/instructions. Pt understood.  

## 2012-06-12 ENCOUNTER — Telehealth: Payer: Self-pay | Admitting: Family Medicine

## 2012-06-12 MED ORDER — LEVOTHYROXINE SODIUM 75 MCG PO TABS: 75.0000 ug | ORAL_TABLET | Freq: Every day | ORAL | Status: DC

## 2012-06-12 NOTE — Telephone Encounter (Signed)
Refill: Synthroid tablet. Take 1 tablet by mouth once daily. Qty 30. Last fill 05-17-12

## 2012-06-12 NOTE — Telephone Encounter (Signed)
rx sent to pharmacy by e-script #30 with 1 refill Letter has been mailed to pt address noted in the chart to advise they are overdue for cpe/ov/labs and the pt needs to contact office to set up appt  Pt needs fasting follow up apt to recheck cholesterol

## 2012-07-07 ENCOUNTER — Ambulatory Visit (INDEPENDENT_AMBULATORY_CARE_PROVIDER_SITE_OTHER): Payer: 59 | Admitting: Family Medicine

## 2012-07-07 ENCOUNTER — Encounter: Payer: Self-pay | Admitting: Family Medicine

## 2012-07-07 VITALS — BP 124/85 | HR 86 | Temp 98.3°F | Ht 60.0 in | Wt 112.0 lb

## 2012-07-07 DIAGNOSIS — E785 Hyperlipidemia, unspecified: Secondary | ICD-10-CM

## 2012-07-07 DIAGNOSIS — I10 Essential (primary) hypertension: Secondary | ICD-10-CM

## 2012-07-07 DIAGNOSIS — E039 Hypothyroidism, unspecified: Secondary | ICD-10-CM

## 2012-07-07 LAB — HEPATIC FUNCTION PANEL
ALT: 33 U/L (ref 0–35)
Total Protein: 7.1 g/dL (ref 6.0–8.3)

## 2012-07-07 LAB — BASIC METABOLIC PANEL
BUN: 14 mg/dL (ref 6–23)
CO2: 29 mEq/L (ref 19–32)
Chloride: 102 mEq/L (ref 96–112)
Creatinine, Ser: 0.6 mg/dL (ref 0.4–1.2)

## 2012-07-07 LAB — LIPID PANEL
Cholesterol: 253 mg/dL — ABNORMAL HIGH (ref 0–200)
Triglycerides: 96 mg/dL (ref 0.0–149.0)

## 2012-07-07 NOTE — Assessment & Plan Note (Signed)
Chronic problem, stable.  Check labs to ensure correct dose.  Adjust meds prn.

## 2012-07-07 NOTE — Assessment & Plan Note (Signed)
Chronic problem, excellent control.  Asymptomatic.  No changes at this time.

## 2012-07-07 NOTE — Assessment & Plan Note (Signed)
Chronic problem, stopped statin due to intolerance.  Will check labs and determine next steps in cholesterol tx.  Pt expressed understanding and is in agreement w/ plan.

## 2012-07-07 NOTE — Patient Instructions (Addendum)
Schedule your complete physical in Feb We'll notify you of your lab results and determine the next steps in cholesterol management You look great!!! I'm so glad you're feeling better!!! Happy Labor Day!!!

## 2012-07-07 NOTE — Progress Notes (Signed)
  Subjective:    Patient ID: Cindy Blankenship, female    DOB: March 05, 1951, 61 y.o.   MRN: [id number]  HPI HTN- chronic problem, on Norvasc and HCTZ.  No CP, SOB, HAs, visual changes, edema.  Hyperlipidemia- chronic problem, stopped Lipitor 1 month ago due to leg swelling and 'general miserable feeling'.  sxs resolved w/ stopping med.  Due for labs today.  Hypothyroid- chronic problem, on synthroid 75.  Denies heat/cold intolerance, fatigue.   Review of Systems For ROS see HPI     Objective:   Physical Exam  Vitals reviewed. Constitutional: She is oriented to person, place, and time. She appears well-developed and well-nourished. No distress.  HENT:  Head: Normocephalic and atraumatic.  Eyes: Conjunctivae and EOM are normal. Pupils are equal, round, and reactive to light.  Neck: Normal range of motion. Neck supple. No thyromegaly present.  Cardiovascular: Normal rate, regular rhythm, normal heart sounds and intact distal pulses.   No murmur heard. Pulmonary/Chest: Effort normal and breath sounds normal. No respiratory distress.  Abdominal: Soft. She exhibits no distension. There is no tenderness.  Musculoskeletal: She exhibits no edema.  Lymphadenopathy:    She has no cervical adenopathy.  Neurological: She is alert and oriented to person, place, and time.  Skin: Skin is warm and dry.  Psychiatric: She has a normal mood and affect. Her behavior is normal.          Assessment & Plan:

## 2012-07-09 ENCOUNTER — Other Ambulatory Visit: Payer: Self-pay | Admitting: *Deleted

## 2012-07-09 MED ORDER — EZETIMIBE 10 MG PO TABS: 10.0000 mg | ORAL_TABLET | Freq: Every day | ORAL | Status: DC

## 2012-07-14 LAB — HM COLONOSCOPY: HM Colonoscopy: NORMAL

## 2012-07-15 ENCOUNTER — Other Ambulatory Visit: Payer: Self-pay | Admitting: Family Medicine

## 2012-07-15 NOTE — Telephone Encounter (Signed)
Refill AmLODIPine (Tab) 5 MG TAKE 1 TABLET DAILY requesting 90-day supply Last wrt 10.10.12 #90 wt/2-refills  Last ov 8.27.13 follow up

## 2012-07-16 NOTE — Telephone Encounter (Signed)
Refill: Hydrochlorothiazide 25mg  tab. Take 1 tablet daily. 90 day supply

## 2012-07-17 MED ORDER — AMLODIPINE BESYLATE 5 MG PO TABS: 5.0000 mg | ORAL_TABLET | Freq: Every day | ORAL | Status: DC

## 2012-07-17 MED ORDER — HYDROCHLOROTHIAZIDE 25 MG PO TABS: 25.0000 mg | ORAL_TABLET | Freq: Every day | ORAL | Status: DC

## 2012-07-17 NOTE — Telephone Encounter (Signed)
Spoke to pt whom advised to send [facility] to [facility] and Amlodipine to mail order pharmacy, sent via escribe to HCTZ with [facility], declined rx request to [facility] for amlodipine per pt wants sent to mail order, sent Amlodipine to mail order via escribe

## 2012-07-17 NOTE — Addendum Note (Signed)
Addended by: Derry Lory A on: 07/17/2012 09:27 AM   Modules accepted: Orders

## 2012-07-22 ENCOUNTER — Ambulatory Visit (INDEPENDENT_AMBULATORY_CARE_PROVIDER_SITE_OTHER): Payer: 59 | Admitting: Family Medicine

## 2012-07-22 ENCOUNTER — Ambulatory Visit (INDEPENDENT_AMBULATORY_CARE_PROVIDER_SITE_OTHER)
Admission: RE | Admit: 2012-07-22 | Discharge: 2012-07-22 | Disposition: A | Discharge status: Home / Self Care | Payer: 59 | Source: Ambulatory Visit | Attending: Family Medicine | Admitting: Family Medicine

## 2012-07-22 ENCOUNTER — Encounter: Payer: Self-pay | Admitting: Family Medicine

## 2012-07-22 VITALS — BP 125/80 | HR 84 | Temp 98.5°F | Ht 61.0 in | Wt 114.0 lb

## 2012-07-22 DIAGNOSIS — R071 Chest pain on breathing: Secondary | ICD-10-CM

## 2012-07-22 DIAGNOSIS — R0781 Pleurodynia: Secondary | ICD-10-CM

## 2012-07-22 MED ORDER — NAPROXEN 500 MG PO TABS: 500.0000 mg | ORAL_TABLET | Freq: Two times a day (BID) | ORAL | Status: AC

## 2012-07-22 NOTE — Progress Notes (Signed)
  Subjective:    Patient ID: Cindy Blankenship, female    DOB: [birth year], 61 y.o.   MRN: [id number]  HPI Shoulder pain- R sided, started last night, noted pain w/ taking a deep breath.  As night went on pain was 'constant'.  Took 2 Motrin w/out relief.  2 hrs ago took 4 motrin w/ some relief but still having discomfort w/ cough or deep breath.  Pain is located around R shoulder blade.  Pain worsens w/ movement.  Last night was unable to find comfortable position.  No recent heavy lifting.  No change in activity level.  No redness or swelling of legs, no recent travel or immobility.  + coughing fits due to allergies.  No SOB, CP, rapid heart beat   Review of Systems For ROS see HPI     Objective:   Physical Exam  Vitals reviewed. Constitutional: She is oriented to person, place, and time. She appears well-developed and well-nourished. No distress.  HENT:  Head: Normocephalic and atraumatic.  Eyes: Conjunctivae normal and EOM are normal. Pupils are equal, round, and reactive to light.  Neck: Normal range of motion. Neck supple. No thyromegaly present.  Cardiovascular: Normal rate, regular rhythm, normal heart sounds and intact distal pulses.   No murmur heard. Pulmonary/Chest: Effort normal and breath sounds normal. No respiratory distress. She has no wheezes. She has no rales. She exhibits no tenderness.       Obviously uncomfortable when taking deep breath  Abdominal: Soft. She exhibits no distension. There is no tenderness.  Musculoskeletal: She exhibits no edema.       Normal ROM of R shoulder, no pain  Lymphadenopathy:    She has no cervical adenopathy.  Neurological: She is alert and oriented to person, place, and time.  Skin: Skin is warm and dry.  Psychiatric: She has a normal mood and affect. Her behavior is normal.          Assessment & Plan:

## 2012-07-22 NOTE — Patient Instructions (Addendum)
Go to [address] and get your chest xray Start the Naproxen twice daily w/ food (1st dose at dinner tonight) HEAT! Call with any questions or concerns Hang in there!!

## 2012-07-23 NOTE — Assessment & Plan Note (Signed)
New.  Pt at low risk for PE (no immobility, recent travel, not a smoker, no family hx, not tachycardic).  Get CXR to r/o PNA, spontaneous pneumothorax, other abnormality.  Suspect pleurisy.  Start scheduled NSAIDs.  Heat prn.  Reviewed supportive care and red flags that should prompt return.  Pt expressed understanding and is in agreement w/ plan.

## 2012-08-17 ENCOUNTER — Other Ambulatory Visit: Payer: Self-pay | Admitting: Family Medicine

## 2012-08-17 MED ORDER — LEVOTHYROXINE SODIUM 75 MCG PO TABS: 75.0000 ug | ORAL_TABLET | Freq: Every day | ORAL | Status: DC

## 2012-08-17 NOTE — Telephone Encounter (Signed)
refill SYNTHROID, 75 MCG #30 wt/1-refill -- Take 1 tablet (75 mcg total) by mouth daily. last fill 9.6.13, last ov 9.11.13 acute

## 2012-08-17 NOTE — Telephone Encounter (Signed)
rx sent to pharmacy by e-script  

## 2012-10-07 ENCOUNTER — Encounter: Payer: Self-pay | Admitting: Gynecology

## 2012-10-15 ENCOUNTER — Other Ambulatory Visit: Payer: Self-pay | Admitting: *Deleted

## 2012-10-15 DIAGNOSIS — R928 Other abnormal and inconclusive findings on diagnostic imaging of breast: Secondary | ICD-10-CM

## 2012-10-16 ENCOUNTER — Encounter: Payer: Self-pay | Admitting: Gynecology

## 2012-10-16 ENCOUNTER — Ambulatory Visit (INDEPENDENT_AMBULATORY_CARE_PROVIDER_SITE_OTHER): Payer: 59 | Admitting: Gynecology

## 2012-10-16 VITALS — BP 130/70 | Ht 60.0 in | Wt 114.0 lb

## 2012-10-16 DIAGNOSIS — M8580 Other specified disorders of bone density and structure, unspecified site: Secondary | ICD-10-CM

## 2012-10-16 DIAGNOSIS — Z79890 Hormone replacement therapy (postmenopausal): Secondary | ICD-10-CM

## 2012-10-16 DIAGNOSIS — N816 Rectocele: Secondary | ICD-10-CM

## 2012-10-16 DIAGNOSIS — Z01419 Encounter for gynecological examination (general) (routine) without abnormal findings: Secondary | ICD-10-CM

## 2012-10-16 DIAGNOSIS — M949 Disorder of cartilage, unspecified: Secondary | ICD-10-CM

## 2012-10-16 DIAGNOSIS — N8111 Cystocele, midline: Secondary | ICD-10-CM

## 2012-10-16 NOTE — Progress Notes (Signed)
Chrishonda Hesch 1951/05/29 [id number]        61 y.o.  [id number] for annual exam.  Several issues noted below.  Past medical history,surgical history, medications, allergies, family history and social history were all reviewed and documented in the EPIC chart. ROS:  Was performed and pertinent positives and negatives are included in the history.  Exam: Sherrilyn Rist assistant Filed Vitals:   10/16/12 1515  BP: 130/70  Height: 5' (1.524 m)  Weight: 114 lb (51.71 kg)   General appearance  Normal Skin grossly normal Head/Neck normal with no cervical or supraclavicular adenopathy thyroid normal Lungs  clear Cardiac RR, without RMG Abdominal  soft, nontender, without masses, organomegaly or hernia Breasts  examined lying and sitting without masses, retractions, discharge or axillary adenopathy. Pelvic  Ext/BUS/vagina  Second degree cystocele, first degree rectocele, cuff well supported  Adnexa  Without masses or tenderness    Anus and perineum  normal   Rectovaginal  normal sphincter tone without palpated masses or tenderness.    Assessment/Plan:  61 y.o. [id number] female for annual exam.   1. Cystocele/rectocele. Patient continues to have intermittent symptoms. I again discussed anterior/posterior colporrhaphy options versus observation. Patient continues to want to observe and will follow up if she wants to pursue surgery. She's not having incontinence or other issues. 2. Osteopenia. DEXA 10/2010 with T score -1.7. FRAX 7.4%/0.8%. Repeat DEXA now that two-year interval and patient will schedule. 3. HRT. Patient on Vivelle 0.1 mg patch. Has tried to wean intermittently. We again reviewed the issues of ERT to include possible increased risk of stroke heart attack DVT of breast cancer issues. The advantage of transdermal also discussed. I gave the patient samples of 0.05 MiniVivelle x2 weeks and she will try these and call the end of 2 weeks to see how she's doing and we will refill doing well or just  dosage if needed. Patient wants to stay on ERT at this point accepting the risks. 4. Pap smear 2011.  No Pap smear done today.  History of VAIN 1 in 2002. Her follow up Pap smears have been normal. Issues of Pap smears after hysterectomy for benign indications and stop screening discussed.  Given her VAIN history at this point we'll plan less frequent screening at 3 year interval and we will do a Pap smear next year. 5. Mammography. Patient just had a follow up view done today. Assuming negative plain annual mammography or follow up recommendation from radiologist.  SBE monthly reviewed. 6. Colonoscopy 03/2012. Follow up recommended interval. 7. Health maintenance. No lab work done as it is all done through her primary physician's office who she sees on a regular basis. Follow up one year, sooner as needed.    Dara Lords MD, 4:37 PM 10/16/2012

## 2012-10-16 NOTE — Patient Instructions (Addendum)
Follow up for bone density Follow up in one year

## 2012-10-17 LAB — URINALYSIS W MICROSCOPIC + REFLEX CULTURE
Bilirubin Urine: NEGATIVE
Casts: NONE SEEN
Crystals: NONE SEEN
Glucose, UA: NEGATIVE mg/dL
Ketones, ur: NEGATIVE mg/dL
Specific Gravity, Urine: 1.009 (ref 1.005–1.030)
Squamous Epithelial / LPF: NONE SEEN

## 2012-10-21 ENCOUNTER — Other Ambulatory Visit: Payer: Self-pay | Admitting: Gynecology

## 2012-10-21 DIAGNOSIS — R928 Other abnormal and inconclusive findings on diagnostic imaging of breast: Secondary | ICD-10-CM

## 2012-12-22 ENCOUNTER — Telehealth: Payer: Self-pay | Admitting: Family Medicine

## 2012-12-22 DIAGNOSIS — E039 Hypothyroidism, unspecified: Secondary | ICD-10-CM

## 2012-12-22 MED ORDER — LEVOTHYROXINE SODIUM 75 MCG PO TABS: 75.0000 ug | ORAL_TABLET | Freq: Every day | ORAL | Status: DC

## 2012-12-22 NOTE — Telephone Encounter (Signed)
refill SYNTHROID, 75 MCG Take 1 tablet (75 mcg total) by mouth daily. #30 wt/3-refills last fill 1.10.14

## 2012-12-22 NOTE — Telephone Encounter (Signed)
Refill for synthroid sent to [facility] on [address]

## 2012-12-27 ENCOUNTER — Other Ambulatory Visit: Payer: Self-pay | Admitting: Gynecology

## 2012-12-31 ENCOUNTER — Telehealth: Payer: Self-pay | Admitting: Family Medicine

## 2012-12-31 MED ORDER — ZOLPIDEM TARTRATE 5 MG PO TABS: 5.0000 mg | ORAL_TABLET | Freq: Every evening | ORAL | Status: DC | PRN

## 2012-12-31 NOTE — Telephone Encounter (Signed)
RX called in .

## 2012-12-31 NOTE — Telephone Encounter (Signed)
Ok for #30, 3 refills 

## 2012-12-31 NOTE — Telephone Encounter (Signed)
Ok to refill? Last OV 9.11.13 Last filled 6.4.13

## 2012-12-31 NOTE — Telephone Encounter (Signed)
Refill: zolpidem tartrate 5 mg tablet. Take 1 tablet by mouth at bedtime if needed for sleep. Last fill 10-09-12

## 2013-01-06 ENCOUNTER — Telehealth: Payer: Self-pay | Admitting: Family Medicine

## 2013-01-06 NOTE — Telephone Encounter (Signed)
refill  Hyoscyamine ER 0.375 MG Take 1 tablet (0.375 mg total) by mouth every 12 (twelve) hours as needed for cramping. #60 wt/3-refills last fill 11.19.13

## 2013-01-07 NOTE — Telephone Encounter (Signed)
Please advise on RF request.  Last CPE:01-09-12.//AB/CMA

## 2013-01-08 MED ORDER — HYOSCYAMINE SULFATE ER 0.375 MG PO TB12: 0.3750 mg | ORAL_TABLET | Freq: Two times a day (BID) | ORAL | Status: DC | PRN

## 2013-01-08 NOTE — Telephone Encounter (Signed)
Ok for #60, 1 refill.  Needs to schedule either CPE or OV for f/u on BP and cholesterol

## 2013-01-08 NOTE — Telephone Encounter (Signed)
Rx sent to the pharmacy([facility]) by e-script.   LM @ (5:50pm) informed the pt refill has been sent to the pharmacy, but we need for her to call the office and schedule an appt for CPE or f/u OV on BP and cholesterol.  Asked the pt to please call if she has any questions.//AB/CMA

## 2013-01-18 ENCOUNTER — Other Ambulatory Visit: Payer: Self-pay | Admitting: Family Medicine

## 2013-02-04 ENCOUNTER — Ambulatory Visit (INDEPENDENT_AMBULATORY_CARE_PROVIDER_SITE_OTHER): Payer: 59

## 2013-02-04 DIAGNOSIS — M899 Disorder of bone, unspecified: Secondary | ICD-10-CM

## 2013-02-04 DIAGNOSIS — M8580 Other specified disorders of bone density and structure, unspecified site: Secondary | ICD-10-CM

## 2013-02-05 ENCOUNTER — Encounter: Payer: Self-pay | Admitting: Gynecology

## 2013-02-09 ENCOUNTER — Encounter: Payer: Self-pay | Admitting: Lab

## 2013-02-10 ENCOUNTER — Encounter: Payer: Self-pay | Admitting: Family Medicine

## 2013-02-10 ENCOUNTER — Ambulatory Visit (INDEPENDENT_AMBULATORY_CARE_PROVIDER_SITE_OTHER): Payer: 59 | Admitting: Family Medicine

## 2013-02-10 VITALS — BP 110/70 | HR 65 | Temp 98.0°F | Ht 59.75 in | Wt 113.4 lb

## 2013-02-10 DIAGNOSIS — Z0000 Encounter for general adult medical examination without abnormal findings: Secondary | ICD-10-CM

## 2013-02-10 DIAGNOSIS — E785 Hyperlipidemia, unspecified: Secondary | ICD-10-CM

## 2013-02-10 DIAGNOSIS — Z1331 Encounter for screening for depression: Secondary | ICD-10-CM

## 2013-02-10 MED ORDER — EZETIMIBE 10 MG PO TABS: 10.0000 mg | ORAL_TABLET | Freq: Every day | ORAL | Status: DC

## 2013-02-10 NOTE — Assessment & Plan Note (Signed)
Pt's PE WNL.  UTD on health maintenance.  Check labs.  Anticipatory guidance provided.  

## 2013-02-10 NOTE — Patient Instructions (Addendum)
Follow up in 6 months to recheck cholesterol Start the Zetia daily We'll notify you of your lab results and make any changes if needed Keep up the good work!  You look great! Happy Spring!!!

## 2013-02-10 NOTE — Assessment & Plan Note (Signed)
Chronic problem.  Pt tolerated Zetia w/out difficulty but never called to report so never got script.  Will restart today.  Pt expressed understanding and is in agreement w/ plan.

## 2013-02-10 NOTE — Progress Notes (Signed)
  Subjective:    Patient ID: Cindy Blankenship, female    DOB: Nov 27, 1950, 62 y.o.   MRN: [id number]  HPI CPE- UTD on colonoscopy, mammo, DEXA.  (GYN- [facility])   Review of Systems Patient reports no vision/ hearing changes, adenopathy,fever, weight change,  persistant/recurrent hoarseness , swallowing issues, chest pain, palpitations, edema, persistant/recurrent cough, hemoptysis, dyspnea (rest/exertional/paroxysmal nocturnal), gastrointestinal bleeding (melena, rectal bleeding), abdominal pain, significant heartburn, bowel changes, GU symptoms (dysuria, hematuria, incontinence), Gyn symptoms (abnormal  bleeding, pain),  syncope, focal weakness, memory loss, numbness & tingling, skin/hair/nail changes, abnormal bruising or bleeding, anxiety, or depression.     Objective:   Physical Exam General Appearance:    Alert, cooperative, no distress, appears stated age  Head:    Normocephalic, without obvious abnormality, atraumatic  Eyes:    PERRL, conjunctiva/corneas clear, EOM's intact, fundi    benign, both eyes  Ears:    Normal TM's and external ear canals, both ears  Nose:   Nares normal, septum midline, mucosa normal, no drainage    or sinus tenderness  Throat:   Lips, mucosa, and tongue normal; teeth and gums normal  Neck:   Supple, symmetrical, trachea midline, no adenopathy;    Thyroid: no enlargement/tenderness/nodules  Back:     Symmetric, no curvature, ROM normal, no CVA tenderness  Lungs:     Clear to auscultation bilaterally, respirations unlabored  Chest Wall:    No tenderness or deformity   Heart:    Regular rate and rhythm, S1 and S2 normal, no murmur, rub   or gallop  Breast Exam:    Deferred to GYN  Abdomen:     Soft, non-tender, bowel sounds active all four quadrants,    no masses, no organomegaly  Genitalia:    Deferred to GYN  Rectal:    Extremities:   Extremities normal, atraumatic, no cyanosis or edema  Pulses:   2+ and symmetric all extremities  Skin:   Skin color,  texture, turgor normal, no rashes or lesions  Lymph nodes:   Cervical, supraclavicular, and axillary nodes normal  Neurologic:   CNII-XII intact, normal strength, sensation and reflexes    throughout          Assessment & Plan:

## 2013-02-11 LAB — HEPATIC FUNCTION PANEL
AST: 28 U/L (ref 0–37)
Albumin: 4.3 g/dL (ref 3.5–5.2)
Alkaline Phosphatase: 71 U/L (ref 39–117)
Total Protein: 7.6 g/dL (ref 6.0–8.3)

## 2013-02-11 LAB — BASIC METABOLIC PANEL
Chloride: 97 mEq/L (ref 96–112)
Creatinine, Ser: 0.7 mg/dL (ref 0.4–1.2)
Sodium: 135 mEq/L (ref 135–145)

## 2013-02-11 LAB — LIPID PANEL: Triglycerides: 134 mg/dL (ref 0.0–149.0)

## 2013-02-11 LAB — CBC WITH DIFFERENTIAL/PLATELET
Basophils Absolute: 0 10*3/uL (ref 0.0–0.1)
Eosinophils Absolute: 0.2 10*3/uL (ref 0.0–0.7)
Hemoglobin: 13.8 g/dL (ref 12.0–15.0)
Lymphocytes Relative: 29 % (ref 12.0–46.0)
MCHC: 33.8 g/dL (ref 30.0–36.0)
Monocytes Relative: 4.6 % (ref 3.0–12.0)
Neutro Abs: 3.7 10*3/uL (ref 1.4–7.7)
Neutrophils Relative %: 62.7 % (ref 43.0–77.0)
Platelets: 268 10*3/uL (ref 150.0–400.0)
RDW: 12.9 % (ref 11.5–14.6)

## 2013-02-11 LAB — LDL CHOLESTEROL, DIRECT: Direct LDL: 172.5 mg/dL

## 2013-02-14 LAB — VITAMIN D 1,25 DIHYDROXY
Vitamin D 1, 25 (OH)2 Total: 54 pg/mL (ref 18–72)
Vitamin D3 1, 25 (OH)2: 54 pg/mL

## 2013-02-15 ENCOUNTER — Other Ambulatory Visit: Payer: Self-pay | Admitting: *Deleted

## 2013-02-15 DIAGNOSIS — M8580 Other specified disorders of bone density and structure, unspecified site: Secondary | ICD-10-CM

## 2013-02-19 ENCOUNTER — Other Ambulatory Visit: Payer: 59

## 2013-02-19 DIAGNOSIS — M8580 Other specified disorders of bone density and structure, unspecified site: Secondary | ICD-10-CM

## 2013-02-20 LAB — VITAMIN D 25 HYDROXY (VIT D DEFICIENCY, FRACTURES): Vit D, 25-Hydroxy: 55 ng/mL (ref 30–89)

## 2013-03-01 ENCOUNTER — Other Ambulatory Visit: Payer: Self-pay | Admitting: Family Medicine

## 2013-03-01 NOTE — Telephone Encounter (Signed)
Med filled electronically on 4/21.

## 2013-03-11 ENCOUNTER — Encounter: Payer: Self-pay | Admitting: Family Medicine

## 2013-03-15 ENCOUNTER — Encounter: Payer: Self-pay | Admitting: Family Medicine

## 2013-03-15 NOTE — Telephone Encounter (Signed)
Please advise.//AB/CMA 

## 2013-04-23 ENCOUNTER — Other Ambulatory Visit: Payer: Self-pay | Admitting: Family Medicine

## 2013-05-24 ENCOUNTER — Other Ambulatory Visit: Payer: Self-pay

## 2013-05-24 ENCOUNTER — Encounter: Payer: Self-pay | Admitting: Family Medicine

## 2013-05-24 MED ORDER — AMLODIPINE BESYLATE 5 MG PO TABS: 5.0000 mg | ORAL_TABLET | Freq: Every day | ORAL | Status: DC

## 2013-06-11 ENCOUNTER — Other Ambulatory Visit: Payer: Self-pay | Admitting: Family Medicine

## 2013-07-28 ENCOUNTER — Other Ambulatory Visit: Payer: Self-pay | Admitting: Family Medicine

## 2013-07-28 NOTE — Telephone Encounter (Signed)
Last filled: 01/08/13  Last visit: 02/10/2013-CPE  Is ok to refill?  SW, CMA

## 2013-08-09 ENCOUNTER — Encounter: Payer: Self-pay | Admitting: Internal Medicine

## 2013-08-11 ENCOUNTER — Other Ambulatory Visit: Payer: Self-pay | Admitting: Family Medicine

## 2013-08-11 ENCOUNTER — Ambulatory Visit (INDEPENDENT_AMBULATORY_CARE_PROVIDER_SITE_OTHER): Payer: 59 | Admitting: Family Medicine

## 2013-08-11 ENCOUNTER — Encounter: Payer: Self-pay | Admitting: *Deleted

## 2013-08-11 VITALS — BP 124/82 | HR 70 | Temp 97.8°F | Resp 16 | Wt 115.5 lb

## 2013-08-11 DIAGNOSIS — R1013 Epigastric pain: Secondary | ICD-10-CM | POA: Insufficient documentation

## 2013-08-11 DIAGNOSIS — Z23 Encounter for immunization: Secondary | ICD-10-CM

## 2013-08-11 LAB — HM DEXA SCAN

## 2013-08-11 LAB — HM MAMMOGRAPHY: HM Mammogram: NORMAL

## 2013-08-11 MED ORDER — PANTOPRAZOLE SODIUM 40 MG PO TBEC: 40.0000 mg | DELAYED_RELEASE_TABLET | Freq: Every day | ORAL | Status: DC

## 2013-08-11 NOTE — Patient Instructions (Addendum)
We'll notify you of your lab results and make any changes if needed I suspect this is reflux and the Protonix will improve this Try and find a stress outlet Please call if symptoms are worsening or not improving Hang in there!!!

## 2013-08-11 NOTE — Assessment & Plan Note (Signed)
New.  Pt's sxs and benign PE consistent w/ GERD.  Start PPI.  Check labs to r/o other possible causes of epigastric pain (biliary dysfunction, pancreatitis, H pylori, infxn

## 2013-08-11 NOTE — Progress Notes (Signed)
  Subjective:    Patient ID: Coreena Rubalcava, female    DOB: 14-Jul-1951, 62 y.o.   MRN: [id number]  HPI Upper abd pain- sxs started 3 weeks ago.  Worse w/ eating.  Increased belching.  Now having some mild heartburn.  Sensation of pressure in center, just under sternum.  Some sensation of creeping up into chest/throat.  No hx of GERD.  Increased stress recently.  Increased IBS issues recently.   Review of Systems For ROS see HPI     Objective:   Physical Exam  Vitals reviewed. Constitutional: She is oriented to person, place, and time. She appears well-developed and well-nourished. No distress.  HENT:  Head: Normocephalic and atraumatic.  Eyes: Conjunctivae and EOM are normal. Pupils are equal, round, and reactive to light.  Neck: Normal range of motion. Neck supple. No thyromegaly present.  Cardiovascular: Normal rate, regular rhythm, normal heart sounds and intact distal pulses.   No murmur heard. Pulmonary/Chest: Effort normal and breath sounds normal. No respiratory distress.  Abdominal: Soft. She exhibits no distension. There is no tenderness. There is no rebound and no guarding.  Musculoskeletal: She exhibits no edema.  Lymphadenopathy:    She has no cervical adenopathy.  Neurological: She is alert and oriented to person, place, and time.  Skin: Skin is warm and dry.  Psychiatric: She has a normal mood and affect. Her behavior is normal.          Assessment & Plan:

## 2013-08-12 LAB — HEPATIC FUNCTION PANEL
AST: 25 U/L (ref 0–37)
Alkaline Phosphatase: 68 U/L (ref 39–117)
Total Bilirubin: 0.4 mg/dL (ref 0.3–1.2)

## 2013-08-12 LAB — BASIC METABOLIC PANEL
BUN: 19 mg/dL (ref 6–23)
Calcium: 9.7 mg/dL (ref 8.4–10.5)
GFR: 114.66 mL/min (ref >60.00)
Potassium: 3.3 mEq/L — ABNORMAL LOW (ref 3.5–5.1)
Sodium: 137 mEq/L (ref 135–145)

## 2013-08-12 LAB — CBC WITH DIFFERENTIAL/PLATELET
Eosinophils Absolute: 0.2 10*3/uL (ref 0.0–0.7)
Lymphs Abs: 1.7 10*3/uL (ref 0.7–4.0)
MCHC: 34 g/dL (ref 30.0–36.0)
MCV: 84 fl (ref 78.0–100.0)
Monocytes Absolute: 0.2 10*3/uL (ref 0.1–1.0)
Neutrophils Relative %: 60.4 % (ref 43.0–77.0)
Platelets: 269 10*3/uL (ref 150.0–400.0)

## 2013-08-12 LAB — LIPASE: Lipase: 42 U/L (ref 11.0–59.0)

## 2013-08-12 LAB — H. PYLORI ANTIBODY, IGG: H Pylori IgG: NEGATIVE

## 2013-08-12 NOTE — Telephone Encounter (Signed)
Last filled: 12/31/2012 #30, 3 refills  Last visit: 08/11/2013  UDS-02/10/2013-Low risk, contract on file  Please advise. SW

## 2013-08-27 ENCOUNTER — Other Ambulatory Visit: Payer: Self-pay | Admitting: Family Medicine

## 2013-08-27 NOTE — Telephone Encounter (Signed)
Med filled.  

## 2013-09-29 ENCOUNTER — Encounter: Payer: Self-pay | Admitting: Family Medicine

## 2013-09-29 ENCOUNTER — Ambulatory Visit (INDEPENDENT_AMBULATORY_CARE_PROVIDER_SITE_OTHER): Payer: 59 | Admitting: Family Medicine

## 2013-09-29 VITALS — BP 122/80 | HR 74 | Temp 98.1°F | Resp 16 | Wt 116.1 lb

## 2013-09-29 DIAGNOSIS — J0100 Acute maxillary sinusitis, unspecified: Secondary | ICD-10-CM

## 2013-09-29 MED ORDER — FLUTICASONE PROPIONATE 50 MCG/ACT NA SUSP: 2.0000 | Freq: Every day | NASAL | Status: DC

## 2013-09-29 MED ORDER — CLARITHROMYCIN ER 500 MG PO TB24: 1000.0000 mg | ORAL_TABLET | Freq: Every day | ORAL | Status: DC

## 2013-09-29 NOTE — Patient Instructions (Signed)
Follow up as needed Start the Biaxin- 2 tabs at the same time daily- take w/ food Restart the nasal spray- 2 sprays each nostril Drink plenty of fluids REST! Call with any questions or concerns Happy BIRTHDAY!!!

## 2013-09-29 NOTE — Assessment & Plan Note (Signed)
New.  Pt's sxs and PE consistent w/ infxn.  Start abx.  Restart nasal steroid to prevent sinus congestion and infxn.  Reviewed supportive care and red flags that should prompt return.  Pt expressed understanding and is in agreement w/ plan.

## 2013-09-29 NOTE — Progress Notes (Signed)
  Subjective:    Patient ID: Cindy Blankenship, female    DOB: 03-16-1951, 62 y.o.   MRN: [id number]  HPI Pre visit review using our clinic review tool, if applicable. No additional management support is needed unless otherwise documented below in the visit note.   URI- sxs started 'a couple of weeks, maybe more'.  Was previously on nasal steroid w/ good relief.  + nasal congestion, facial pain, tooth pain.  No fevers.  + cough.  No N/V/D.  L ear discomfort.   Review of Systems For ROS see HPI     Objective:   Physical Exam  Vitals reviewed. Constitutional: She appears well-developed and well-nourished. No distress.  HENT:  Head: Normocephalic and atraumatic.  Right Ear: Tympanic membrane normal.  Left Ear: Tympanic membrane normal.  Nose: Mucosal edema and rhinorrhea present. Right sinus exhibits maxillary sinus tenderness and frontal sinus tenderness. Left sinus exhibits maxillary sinus tenderness and frontal sinus tenderness.  Mouth/Throat: Uvula is midline and mucous membranes are normal. Posterior oropharyngeal erythema present. No oropharyngeal exudate.  Eyes: Conjunctivae and EOM are normal. Pupils are equal, round, and reactive to light.  Neck: Normal range of motion. Neck supple.  Cardiovascular: Normal rate, regular rhythm and normal heart sounds.   Pulmonary/Chest: Effort normal and breath sounds normal. No respiratory distress. She has no wheezes.  Lymphadenopathy:    She has no cervical adenopathy.          Assessment & Plan:

## 2013-10-05 ENCOUNTER — Ambulatory Visit: Payer: 59 | Admitting: Internal Medicine

## 2013-10-06 ENCOUNTER — Ambulatory Visit (INDEPENDENT_AMBULATORY_CARE_PROVIDER_SITE_OTHER): Payer: 59 | Admitting: *Deleted

## 2013-10-06 DIAGNOSIS — Z23 Encounter for immunization: Secondary | ICD-10-CM

## 2013-10-06 DIAGNOSIS — Z2911 Encounter for prophylactic immunotherapy for respiratory syncytial virus (RSV): Secondary | ICD-10-CM

## 2013-10-14 ENCOUNTER — Encounter: Payer: Self-pay | Admitting: Gynecology

## 2013-10-20 ENCOUNTER — Encounter: Payer: 59 | Admitting: Gynecology

## 2013-10-26 ENCOUNTER — Encounter: Payer: Self-pay | Admitting: Gynecology

## 2013-10-26 ENCOUNTER — Other Ambulatory Visit (HOSPITAL_COMMUNITY)
Admission: RE | Admit: 2013-10-26 | Discharge: 2013-10-26 | Disposition: A | Discharge status: Home / Self Care | Payer: 59 | Source: Ambulatory Visit | Attending: Gynecology | Admitting: Gynecology

## 2013-10-26 ENCOUNTER — Ambulatory Visit (INDEPENDENT_AMBULATORY_CARE_PROVIDER_SITE_OTHER): Payer: 59 | Admitting: Gynecology

## 2013-10-26 VITALS — BP 124/76 | Ht 60.0 in | Wt 113.0 lb

## 2013-10-26 DIAGNOSIS — M899 Disorder of bone, unspecified: Secondary | ICD-10-CM

## 2013-10-26 DIAGNOSIS — Z01419 Encounter for gynecological examination (general) (routine) without abnormal findings: Secondary | ICD-10-CM

## 2013-10-26 DIAGNOSIS — N8111 Cystocele, midline: Secondary | ICD-10-CM

## 2013-10-26 DIAGNOSIS — N893 Dysplasia of vagina, unspecified: Secondary | ICD-10-CM

## 2013-10-26 DIAGNOSIS — N816 Rectocele: Secondary | ICD-10-CM | POA: Insufficient documentation

## 2013-10-26 DIAGNOSIS — Z79890 Hormone replacement therapy (postmenopausal): Secondary | ICD-10-CM

## 2013-10-26 DIAGNOSIS — N890 Mild vaginal dysplasia: Secondary | ICD-10-CM

## 2013-10-26 DIAGNOSIS — IMO0002 Reserved for concepts with insufficient information to code with codable children: Secondary | ICD-10-CM | POA: Insufficient documentation

## 2013-10-26 DIAGNOSIS — M8580 Other specified disorders of bone density and structure, unspecified site: Secondary | ICD-10-CM

## 2013-10-26 LAB — URINALYSIS W MICROSCOPIC + REFLEX CULTURE
Bacteria, UA: NONE SEEN
Bilirubin Urine: NEGATIVE
Casts: NONE SEEN
Ketones, ur: NEGATIVE mg/dL
Nitrite: NEGATIVE
Protein, ur: NEGATIVE mg/dL
Specific Gravity, Urine: 1.017 (ref 1.005–1.030)
Urobilinogen, UA: 0.2 mg/dL (ref 0.0–1.0)

## 2013-10-26 MED ORDER — ESTRADIOL 0.1 MG/24HR TD PTTW: 1.0000 | MEDICATED_PATCH | TRANSDERMAL | Status: DC

## 2013-10-26 NOTE — Progress Notes (Signed)
Cindy Blankenship [birth year] [id number]        62 y.o.  [id number] for annual exam.  Several issues noted below.  Past medical history,surgical history, problem list, medications, allergies, family history and social history were all reviewed and documented in the EPIC chart.  ROS:  Performed and pertinent positives and negatives are included in the history, assessment and plan .  Exam: Kim assistant Filed Vitals:   10/26/13 0800  BP: 124/76  Height: 5' (1.524 m)  Weight: 113 lb (51.256 kg)   General appearance  Normal Skin grossly normal Head/Neck normal with no cervical or supraclavicular adenopathy thyroid normal Lungs  clear Cardiac RR, without RMG Abdominal  soft, nontender, without masses, organomegaly or hernia Breasts  examined lying and sitting without masses, retractions, discharge or axillary adenopathy. Pelvic  Ext/BUS/vagina  second-degree cystocele, first degree rectocele, cuff well supported. Pap of cuff done  Adnexa  Without masses or tenderness    Anus and perineum  Normal   Rectovaginal  Normal sphincter tone without palpated masses or tenderness.    Assessment/Plan:  62 y.o. [id number] female for annual exam.   1. Postmenopausal/HRT/status post TAH LSO. On Vivelle 0.1 mg patches. We were going to switch her to the Minivelle 0.05 last year but she never did this and stayed on the 0.1 and feels comfortable on this.  I again reviewed the whole issue of HRT with her to include the WHI study with increased risk of stroke, heart attack, DVT and breast cancer. The ACOG and NAMS statements for lowest dose for the shortest period of time reviewed. Transdermal versus oral first-pass effect benefit discussed. Patient prefers to continue accepting the risks and I refilled her Vivelle 0.1 mg patches x1 year 2. Cystocele/rectocele. Cuff well supported. Stable and not overly symptomatic to the patient. We have discussed options to include surgery versus observation and she prefers  observation at this time. 3. Osteopenia. DEXA 01/2013 T score -1.9 FRAX 8.6%/1.1%. Vitamin D 55 this year. Continue with increased calcium vitamin D. Repeat DEXA at two-year interval. 4. Pap smear 2011. Pap smear done today. 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 We'll plan on continuing screening at a less frequent interval. 5. Mammography 10/2013. Continue annual mammography. SBE monthly reviewed. 6. Colonoscopy 2013. Repeat at their recommended interval. 7. Health maintenance. No blood work done as this is all done through her primary physician's office. Followup one year, sooner as needed.   Note: This document was prepared with digital dictation and possible smart phrase technology. Any transcriptional errors that result from this process are unintentional.   Dara Lords MD, 8:25 AM 10/26/2013

## 2013-10-26 NOTE — Addendum Note (Signed)
Addended by: Dayna Barker on: 10/26/2013 08:33 AM   Modules accepted: Orders

## 2013-10-26 NOTE — Patient Instructions (Signed)
Follow up in one year, sooner as needed. 

## 2013-10-27 ENCOUNTER — Other Ambulatory Visit: Payer: Self-pay | Admitting: Family Medicine

## 2013-10-27 LAB — URINE CULTURE: Colony Count: NO GROWTH

## 2013-10-27 NOTE — Telephone Encounter (Signed)
Med filled, pt needs follow up appt.

## 2013-12-28 ENCOUNTER — Other Ambulatory Visit: Payer: Self-pay | Admitting: Family Medicine

## 2013-12-29 ENCOUNTER — Other Ambulatory Visit: Payer: Self-pay | Admitting: General Practice

## 2013-12-29 MED ORDER — LEVOTHYROXINE SODIUM 75 MCG PO TABS: ORAL_TABLET | ORAL | Status: DC

## 2013-12-29 NOTE — Telephone Encounter (Signed)
Med filled #30. OV NEEDED.

## 2014-02-01 ENCOUNTER — Other Ambulatory Visit: Payer: Self-pay | Admitting: Family Medicine

## 2014-02-01 NOTE — Telephone Encounter (Signed)
Med filled and letter mailed.  

## 2014-02-04 ENCOUNTER — Encounter: Payer: Self-pay | Admitting: Family Medicine

## 2014-02-04 ENCOUNTER — Ambulatory Visit (INDEPENDENT_AMBULATORY_CARE_PROVIDER_SITE_OTHER): Payer: 59 | Admitting: Family Medicine

## 2014-02-04 VITALS — BP 124/80 | HR 83 | Temp 98.4°F | Resp 17 | Wt 114.0 lb

## 2014-02-04 DIAGNOSIS — J320 Chronic maxillary sinusitis: Secondary | ICD-10-CM

## 2014-02-04 MED ORDER — PROMETHAZINE-DM 6.25-15 MG/5ML PO SYRP: 5.0000 mL | ORAL_SOLUTION | Freq: Four times a day (QID) | ORAL | Status: DC | PRN

## 2014-02-04 MED ORDER — MOXIFLOXACIN HCL 400 MG PO TABS: 400.0000 mg | ORAL_TABLET | Freq: Every day | ORAL | Status: DC

## 2014-02-04 NOTE — Assessment & Plan Note (Signed)
Pt's sxs and PE consistent w/ infxn.  Failed Zpack.  Start Avelox due to pt's multiple allergies.  Reviewed supportive care and red flags that should prompt return.  Pt expressed understanding and is in agreement w/ plan.

## 2014-02-04 NOTE — Patient Instructions (Signed)
Follow up as needed Start the Avelox daily w/ food Drink plenty of fluids Use the cough syrup as needed- will cause drowsiness Mucinex DM for daytime cough REST! Continue the Zyrtec daily Call with any questions or concerns Hang in there!!!

## 2014-02-04 NOTE — Progress Notes (Signed)
   Subjective:    Patient ID: Cindy Blankenship, female    DOB: Feb 19, 1951, 63 y.o.   MRN: [id number]  Sinusitis Associated symptoms include coughing.  Back Pain  Cough   URI- sxs started 2 weeks ago.  [facility] and was treated w/ Zpack and mucinex.  Yesterday again had fever Tm 101.5.  + facial pain/pressure, 'massive HA', hacking cough- intermittently productive.  Now w/ pain between shoulder blades when coughing.  Bilateral ear pressure.  No N/V/D.  + sick contacts.   Review of Systems  Respiratory: Positive for cough.   Musculoskeletal: Positive for back pain.   For ROS see HPI     Objective:   Physical Exam  Vitals reviewed. Constitutional: She appears well-developed and well-nourished. No distress.  HENT:  Head: Normocephalic and atraumatic.  Right Ear: Tympanic membrane normal.  Left Ear: Tympanic membrane normal.  Nose: Mucosal edema and rhinorrhea present. Right sinus exhibits maxillary sinus tenderness. Right sinus exhibits no frontal sinus tenderness. Left sinus exhibits maxillary sinus tenderness. Left sinus exhibits no frontal sinus tenderness.  Mouth/Throat: Uvula is midline and mucous membranes are normal. Posterior oropharyngeal erythema present. No oropharyngeal exudate.  Eyes: Conjunctivae and EOM are normal. Pupils are equal, round, and reactive to light.  Neck: Normal range of motion. Neck supple.  Cardiovascular: Normal rate, regular rhythm and normal heart sounds.   Pulmonary/Chest: Effort normal and breath sounds normal. No respiratory distress. She has no wheezes.  Lymphadenopathy:    She has no cervical adenopathy.          Assessment & Plan:

## 2014-02-04 NOTE — Progress Notes (Signed)
Pre visit review using our clinic review tool, if applicable. No additional management support is needed unless otherwise documented below in the visit note. 

## 2014-02-24 ENCOUNTER — Other Ambulatory Visit: Payer: Self-pay | Admitting: Family Medicine

## 2014-02-25 NOTE — Telephone Encounter (Signed)
Med filled.  

## 2014-03-04 ENCOUNTER — Other Ambulatory Visit: Payer: Self-pay | Admitting: Family Medicine

## 2014-03-04 NOTE — Telephone Encounter (Signed)
Med filled only #30 and letter mailed to make an appt.

## 2014-04-05 ENCOUNTER — Other Ambulatory Visit: Payer: Self-pay | Admitting: Family Medicine

## 2014-04-06 NOTE — Telephone Encounter (Signed)
Ok to refill each (as written)- but needs at least a lab visit for TSH prior to September physical

## 2014-04-06 NOTE — Telephone Encounter (Signed)
Med filled.  

## 2014-04-06 NOTE — Telephone Encounter (Signed)
Last OV 02-04-14 (sinus) Last TSH 02-10-13 Ambien 08-11-13 #30 with 3  CPE scheduled for September.

## 2014-04-08 ENCOUNTER — Other Ambulatory Visit: Payer: Self-pay | Admitting: Family Medicine

## 2014-04-08 NOTE — Telephone Encounter (Signed)
Med filled.  

## 2014-04-28 ENCOUNTER — Ambulatory Visit (INDEPENDENT_AMBULATORY_CARE_PROVIDER_SITE_OTHER): Payer: 59 | Admitting: Family Medicine

## 2014-04-28 ENCOUNTER — Encounter: Payer: Self-pay | Admitting: Family Medicine

## 2014-04-28 VITALS — BP 120/82 | HR 65 | Temp 98.2°F | Resp 16 | Wt 110.4 lb

## 2014-04-28 DIAGNOSIS — E785 Hyperlipidemia, unspecified: Secondary | ICD-10-CM

## 2014-04-28 DIAGNOSIS — I10 Essential (primary) hypertension: Secondary | ICD-10-CM

## 2014-04-28 DIAGNOSIS — E039 Hypothyroidism, unspecified: Secondary | ICD-10-CM

## 2014-04-28 LAB — CBC WITH DIFFERENTIAL/PLATELET
BASOS ABS: 0 10*3/uL (ref 0.0–0.1)
Basophils Relative: 0.5 % (ref 0.0–3.0)
EOS ABS: 0.1 10*3/uL (ref 0.0–0.7)
Eosinophils Relative: 2.7 % (ref 0.0–5.0)
HEMATOCRIT: 42 % (ref 36.0–46.0)
Hemoglobin: 14.1 g/dL (ref 12.0–15.0)
Lymphocytes Relative: 40.3 % (ref 12.0–46.0)
Lymphs Abs: 1.4 10*3/uL (ref 0.7–4.0)
MCHC: 33.5 g/dL (ref 30.0–36.0)
MCV: 86.3 fl (ref 78.0–100.0)
Monocytes Absolute: 0.2 10*3/uL (ref 0.1–1.0)
Monocytes Relative: 6.7 % (ref 3.0–12.0)
Neutro Abs: 1.7 10*3/uL (ref 1.4–7.7)
Neutrophils Relative %: 49.8 % (ref 43.0–77.0)
Platelets: 272 10*3/uL (ref 150.0–400.0)
RBC: 4.87 Mil/uL (ref 3.87–5.11)
RDW: 13.2 % (ref 11.5–15.5)
WBC: 3.4 10*3/uL — ABNORMAL LOW (ref 4.0–10.5)

## 2014-04-28 LAB — BASIC METABOLIC PANEL
BUN: 14 mg/dL (ref 6–23)
CHLORIDE: 100 meq/L (ref 96–112)
CO2: 31 mEq/L (ref 19–32)
Calcium: 9.8 mg/dL (ref 8.4–10.5)
Creatinine, Ser: 0.6 mg/dL (ref 0.4–1.2)
GFR: 116.75 mL/min (ref >60.00)
GLUCOSE: 92 mg/dL (ref 70–99)
POTASSIUM: 3.7 meq/L (ref 3.5–5.1)
SODIUM: 137 meq/L (ref 135–145)

## 2014-04-28 LAB — LIPID PANEL
CHOLESTEROL: 246 mg/dL — AB (ref 0–200)
HDL: 62 mg/dL (ref >39.00)
LDL CALC: 164 mg/dL — AB (ref 0–99)
NonHDL: 184
Total CHOL/HDL Ratio: 4
Triglycerides: 100 mg/dL (ref 0.0–149.0)
VLDL: 20 mg/dL (ref 0.0–40.0)

## 2014-04-28 LAB — HEPATIC FUNCTION PANEL
ALK PHOS: 62 U/L (ref 39–117)
ALT: 20 U/L (ref 0–35)
AST: 21 U/L (ref 0–37)
Albumin: 4.2 g/dL (ref 3.5–5.2)
BILIRUBIN DIRECT: 0 mg/dL (ref 0.0–0.3)
TOTAL PROTEIN: 6.9 g/dL (ref 6.0–8.3)
Total Bilirubin: 0.4 mg/dL (ref 0.2–1.2)

## 2014-04-28 LAB — TSH: TSH: 0.97 u[IU]/mL (ref 0.35–4.50)

## 2014-04-28 NOTE — Assessment & Plan Note (Signed)
Chronic problem.  Asymptomatic despite ongoing constipation.  Check labs.  Adjust meds prn.

## 2014-04-28 NOTE — Progress Notes (Signed)
   Subjective:    Patient ID: Cindy Blankenship, female    DOB: [birth year], 63 y.o.   MRN: [id number]  HPI HTN- chronic problem, well controlled on Amlodipine, HCTZ.  No CP,  SOB, HAs, visual changes, edema.  Hyperlipidemia- chronic problem, previously on Lipitor but has been attempting to control w/ healthy diet and regular exercise.  Strong family hx of high cholesterol  Hypothyroid- chronic problem, on Synthroid daily.  Denies excessive fatigue, no changes to skin/hair/nails, + constipation.   Review of Systems For ROS see HPI     Objective:   Physical Exam  Vitals reviewed. Constitutional: She is oriented to person, place, and time. She appears well-developed and well-nourished. No distress.  HENT:  Head: Normocephalic and atraumatic.  Eyes: Conjunctivae and EOM are normal. Pupils are equal, round, and reactive to light.  Neck: Normal range of motion. Neck supple. No thyromegaly present.  Cardiovascular: Normal rate, regular rhythm, normal heart sounds and intact distal pulses.   No murmur heard. Pulmonary/Chest: Effort normal and breath sounds normal. No respiratory distress.  Abdominal: Soft. She exhibits no distension. There is no tenderness.  Musculoskeletal: She exhibits no edema.  Lymphadenopathy:    She has no cervical adenopathy.  Neurological: She is alert and oriented to person, place, and time.  Skin: Skin is warm and dry.  Psychiatric: She has a normal mood and affect. Her behavior is normal.          Assessment & Plan:

## 2014-04-28 NOTE — Assessment & Plan Note (Signed)
Chronic problem.  Not currently on meds.  Strong family hx.  Check labs.  Start tx prn.  Pt expressed understanding and is in agreement w/ plan.

## 2014-04-28 NOTE — Progress Notes (Signed)
Pre visit review using our clinic review tool, if applicable. No additional management support is needed unless otherwise documented below in the visit note. 

## 2014-04-28 NOTE — Assessment & Plan Note (Signed)
Chronic problem.  Well controlled today on current meds.  Asymptomatic.  Check labs.  No anticipated changes.

## 2014-04-28 NOTE — Patient Instructions (Signed)
Schedule your complete physical in 6 months We'll notify you of your lab results and make any changes if needed Keep up the good work!  You look great! Call with any questions or concerns Have a great trip!!!

## 2014-04-29 ENCOUNTER — Telehealth: Payer: Self-pay | Admitting: Family Medicine

## 2014-04-29 NOTE — Telephone Encounter (Signed)
Relevant patient education assigned to patient using Emmi. ° °

## 2014-05-04 ENCOUNTER — Other Ambulatory Visit: Payer: Self-pay | Admitting: Family Medicine

## 2014-05-04 ENCOUNTER — Encounter: Payer: Self-pay | Admitting: Family Medicine

## 2014-05-04 MED ORDER — SIMVASTATIN 20 MG PO TABS: 20.0000 mg | ORAL_TABLET | Freq: Every day | ORAL | Status: DC

## 2014-05-05 ENCOUNTER — Other Ambulatory Visit: Payer: Self-pay | Admitting: Family Medicine

## 2014-05-06 NOTE — Telephone Encounter (Signed)
Med filled.  

## 2014-06-20 ENCOUNTER — Other Ambulatory Visit: Payer: Self-pay | Admitting: Family Medicine

## 2014-06-21 NOTE — Telephone Encounter (Signed)
Med filled.  

## 2014-07-14 ENCOUNTER — Ambulatory Visit (INDEPENDENT_AMBULATORY_CARE_PROVIDER_SITE_OTHER): Payer: 59 | Admitting: Family Medicine

## 2014-07-14 ENCOUNTER — Encounter: Payer: Self-pay | Admitting: Family Medicine

## 2014-07-14 VITALS — BP 120/74 | HR 77 | Temp 98.0°F | Resp 16 | Ht 60.0 in | Wt 112.4 lb

## 2014-07-14 DIAGNOSIS — Z0000 Encounter for general adult medical examination without abnormal findings: Secondary | ICD-10-CM | POA: Insufficient documentation

## 2014-07-14 LAB — LIPID PANEL
CHOL/HDL RATIO: 3
Cholesterol: 187 mg/dL (ref 0–200)
HDL: 66 mg/dL (ref >39.00)
LDL Cholesterol: 98 mg/dL (ref 0–99)
NONHDL: 121
Triglycerides: 115 mg/dL (ref 0.0–149.0)
VLDL: 23 mg/dL (ref 0.0–40.0)

## 2014-07-14 LAB — HEPATIC FUNCTION PANEL
ALT: 22 U/L (ref 0–35)
AST: 25 U/L (ref 0–37)
Albumin: 4.3 g/dL (ref 3.5–5.2)
Alkaline Phosphatase: 73 U/L (ref 39–117)
BILIRUBIN DIRECT: 0 mg/dL (ref 0.0–0.3)
Total Bilirubin: 0.5 mg/dL (ref 0.2–1.2)
Total Protein: 7.5 g/dL (ref 6.0–8.3)

## 2014-07-14 LAB — CBC WITH DIFFERENTIAL/PLATELET
Basophils Absolute: 0 10*3/uL (ref 0.0–0.1)
Basophils Relative: 0.7 % (ref 0.0–3.0)
EOS PCT: 2.8 % (ref 0.0–5.0)
Eosinophils Absolute: 0.2 10*3/uL (ref 0.0–0.7)
HEMATOCRIT: 43.9 % (ref 36.0–46.0)
Hemoglobin: 15 g/dL (ref 12.0–15.0)
LYMPHS ABS: 1.5 10*3/uL (ref 0.7–4.0)
Lymphocytes Relative: 23.7 % (ref 12.0–46.0)
MCHC: 34.3 g/dL (ref 30.0–36.0)
MCV: 85.6 fl (ref 78.0–100.0)
Monocytes Absolute: 0.4 10*3/uL (ref 0.1–1.0)
Monocytes Relative: 7 % (ref 3.0–12.0)
Neutro Abs: 4.2 10*3/uL (ref 1.4–7.7)
Neutrophils Relative %: 65.8 % (ref 43.0–77.0)
Platelets: 281 10*3/uL (ref 150.0–400.0)
RBC: 5.13 Mil/uL — AB (ref 3.87–5.11)
RDW: 12.9 % (ref 11.5–15.5)
WBC: 6.3 10*3/uL (ref 4.0–10.5)

## 2014-07-14 LAB — BASIC METABOLIC PANEL
BUN: 18 mg/dL (ref 6–23)
CHLORIDE: 99 meq/L (ref 96–112)
CO2: 31 mEq/L (ref 19–32)
CREATININE: 0.6 mg/dL (ref 0.4–1.2)
Calcium: 10.3 mg/dL (ref 8.4–10.5)
GFR: 103.74 mL/min (ref >60.00)
Glucose, Bld: 81 mg/dL (ref 70–99)
POTASSIUM: 5.1 meq/L (ref 3.5–5.1)
SODIUM: 140 meq/L (ref 135–145)

## 2014-07-14 LAB — TSH: TSH: 1.08 u[IU]/mL (ref 0.35–4.50)

## 2014-07-14 LAB — VITAMIN D 25 HYDROXY (VIT D DEFICIENCY, FRACTURES): VITD: 41.42 ng/mL (ref 30.00–100.00)

## 2014-07-14 NOTE — Progress Notes (Signed)
   Subjective:    Patient ID: Cindy Blankenship, female    DOB: 05-12-1951, 63 y.o.   MRN: [id number]  HPI CPE- UTD on GYN (Fontaine), colonoscopy Juanda Chance).  No concerns today.   Review of Systems Patient reports no vision/ hearing changes, adenopathy,fever, weight change,  persistant/recurrent hoarseness , swallowing issues, chest pain, palpitations, edema, persistant/recurrent cough, hemoptysis, dyspnea (rest/exertional/paroxysmal nocturnal), gastrointestinal bleeding (melena, rectal bleeding), abdominal pain, significant heartburn, bowel changes, GU symptoms (dysuria, hematuria, incontinence), Gyn symptoms (abnormal  bleeding, pain),  syncope, focal weakness, memory loss, numbness & tingling, skin/hair/nail changes, abnormal bruising or bleeding, anxiety, or depression.     Objective:   Physical Exam General Appearance:    Alert, cooperative, no distress, appears stated age  Head:    Normocephalic, without obvious abnormality, atraumatic  Eyes:    PERRL, conjunctiva/corneas clear, EOM's intact, fundi    benign, both eyes  Ears:    Normal TM's and external ear canals, both ears  Nose:   Nares normal, septum midline, mucosa normal, no drainage    or sinus tenderness  Throat:   Lips, mucosa, and tongue normal; teeth and gums normal  Neck:   Supple, symmetrical, trachea midline, no adenopathy;    Thyroid: no enlargement/tenderness/nodules  Back:     Symmetric, no curvature, ROM normal, no CVA tenderness  Lungs:     Clear to auscultation bilaterally, respirations unlabored  Chest Wall:    No tenderness or deformity   Heart:    Regular rate and rhythm, S1 and S2 normal, no murmur, rub   or gallop  Breast Exam:    Deferred to GYN  Abdomen:     Soft, non-tender, bowel sounds active all four quadrants,    no masses, no organomegaly  Genitalia:    Deferred to GYN  Rectal:    Extremities:   Extremities normal, atraumatic, no cyanosis or edema  Pulses:   2+ and symmetric all extremities    Skin:   Skin color, texture, turgor normal, no rashes or lesions  Lymph nodes:   Cervical, supraclavicular, and axillary nodes normal  Neurologic:   CNII-XII intact, normal strength, sensation and reflexes    throughout          Assessment & Plan:

## 2014-07-14 NOTE — Assessment & Plan Note (Signed)
Pt's PE WNL.  UTD on GYN, colonoscopy.  Check labs.  Anticipatory guidance provided.  

## 2014-07-14 NOTE — Patient Instructions (Signed)
Follow up in 6 months to recheck cholesterol and BP Keep up the good work!  You look great! We'll notify you of your lab results and make any changes if needed Call with any questions or concerns Happy Labor Day!  Have a safe trip!!!

## 2014-07-14 NOTE — Progress Notes (Signed)
Pre visit review using our clinic review tool, if applicable. No additional management support is needed unless otherwise documented below in the visit note. 

## 2014-08-22 ENCOUNTER — Other Ambulatory Visit: Payer: Self-pay | Admitting: Family Medicine

## 2014-08-22 NOTE — Telephone Encounter (Signed)
Med filled.  

## 2014-08-29 ENCOUNTER — Other Ambulatory Visit: Payer: Self-pay | Admitting: Family Medicine

## 2014-08-29 NOTE — Telephone Encounter (Signed)
Med filled.  

## 2014-09-12 ENCOUNTER — Encounter: Payer: Self-pay | Admitting: Family Medicine

## 2014-09-29 ENCOUNTER — Encounter: Payer: Self-pay | Admitting: Internal Medicine

## 2014-09-30 ENCOUNTER — Encounter: Payer: Self-pay | Admitting: *Deleted

## 2014-10-04 ENCOUNTER — Ambulatory Visit (INDEPENDENT_AMBULATORY_CARE_PROVIDER_SITE_OTHER): Payer: 59 | Admitting: Gynecology

## 2014-10-04 ENCOUNTER — Encounter: Payer: Self-pay | Admitting: Gynecology

## 2014-10-04 DIAGNOSIS — N8111 Cystocele, midline: Secondary | ICD-10-CM

## 2014-10-04 NOTE — Patient Instructions (Signed)
Office will contact you to arrange appointment with urologist.

## 2014-10-04 NOTE — Progress Notes (Signed)
Cindy LullFreda Blankenship 1951/02/06 [id number]        63 y.o.  [id number] Presents with worsening symptoms associated with her cystocele. Patient notes a bulge now when standing or on her feet a long time. Not having urinary symptoms such as frequency dysuria or urgency. Not bothered with incontinence.  Past medical history,surgical history, problem list, medications, allergies, family history and social history were all reviewed and documented in the EPIC chart.  Directed ROS with pertinent positives and negatives documented in the history of present illness/assessment and plan.  Exam: Kim assistant General appearance:  Normal External BUS vagina with second-degree cystocele. Cuff appears well supported. Mild rectocele. Bimanual without masses or tenderness  Assessment/Plan:  63 y.o. [id number] with symptomatic cystocele. Will refer to urology for evaluation and possible surgery.     Dara LordsFONTAINE,TIMOTHY P MD, 12:45 PM 10/04/2014

## 2014-10-10 ENCOUNTER — Telehealth: Payer: Self-pay | Admitting: *Deleted

## 2014-10-10 ENCOUNTER — Other Ambulatory Visit: Payer: Self-pay | Admitting: Family Medicine

## 2014-10-10 NOTE — Telephone Encounter (Signed)
-----   Message from Dara Lordsimothy P Fontaine, MD sent at 10/04/2014 12:48 PM EST ----- Arrange an appointment with Dr[facility]mid at [facility] reference symptomatic cystocele. Status post hysterectomy in the past.

## 2014-10-10 NOTE — Telephone Encounter (Signed)
Referral form filled out and faxed to [facility] they will fax me time and date of appointment. 

## 2014-10-11 NOTE — Telephone Encounter (Signed)
meds filled

## 2014-10-12 NOTE — Telephone Encounter (Signed)
Appointment on 10/17/14 @ 9:00am with Dr.MacDarmid pt informed.

## 2014-10-21 ENCOUNTER — Encounter: Payer: Self-pay | Admitting: Gynecology

## 2014-11-07 ENCOUNTER — Encounter: Payer: Self-pay | Admitting: Gynecology

## 2014-11-22 ENCOUNTER — Other Ambulatory Visit: Payer: Self-pay | Admitting: Family Medicine

## 2014-11-22 NOTE — Telephone Encounter (Signed)
Med filled.  

## 2014-11-23 ENCOUNTER — Ambulatory Visit (INDEPENDENT_AMBULATORY_CARE_PROVIDER_SITE_OTHER): Payer: 59 | Admitting: Gynecology

## 2014-11-23 ENCOUNTER — Encounter: Payer: Self-pay | Admitting: Gynecology

## 2014-11-23 VITALS — BP 120/60 | Ht 60.0 in | Wt 109.0 lb

## 2014-11-23 DIAGNOSIS — N816 Rectocele: Secondary | ICD-10-CM

## 2014-11-23 DIAGNOSIS — M8580 Other specified disorders of bone density and structure, unspecified site: Secondary | ICD-10-CM

## 2014-11-23 DIAGNOSIS — N8111 Cystocele, midline: Secondary | ICD-10-CM

## 2014-11-23 DIAGNOSIS — Z01419 Encounter for gynecological examination (general) (routine) without abnormal findings: Secondary | ICD-10-CM

## 2014-11-23 DIAGNOSIS — Z79890 Hormone replacement therapy (postmenopausal): Secondary | ICD-10-CM

## 2014-11-23 MED ORDER — ESTRADIOL 0.1 MG/24HR TD PTTW: 1.0000 | MEDICATED_PATCH | TRANSDERMAL | Status: DC

## 2014-11-23 NOTE — Progress Notes (Signed)
Cindy Blankenship 1950/11/18 [id number]        64 y.o.  [id number] for annual exam.  Several issues noted below.  Past medical history,surgical history, problem list, medications, allergies, family history and social history were all reviewed and documented as reviewed in the EPIC chart.  ROS:  Performed with pertinent positives and negatives included in the history, assessment and plan.   Additional significant findings :  none   Exam: [facility] personassistant Filed Vitals:   11/23/14 0858  BP: 120/60  Height: 5' (1.524 m)  Weight: 109 lb (49.442 kg)   General appearance:  Normal affect, orientation and appearance. Skin: Grossly normal HEENT: Without gross lesions.  No cervical or supraclavicular adenopathy. Thyroid normal.  Lungs:  Clear without wheezing, rales or rhonchi Cardiac: RR, without RMG Abdominal:  Soft, nontender, without masses, guarding, rebound, organomegaly or hernia Breasts:  Examined lying and sitting without masses, retractions, discharge or axillary adenopathy. Pelvic:  Ext/BUS/vagina with mild atrophic changes. Second-degree cystocele. First to second degree rectocele. Cuff well supported.  Adnexa  Without masses or tenderness    Anus and perineum  Normal   Rectovaginal  Normal sphincter tone without palpated masses or tenderness.    Assessment/Plan:  64 y.o. [name] female for annual exam.   1. Postmenopausal/HRT. Status post TAH LSO in the past. On Vivelle 0.1 mg patches. Has tried to wean recently with unacceptable hot flashes and night sweats. Patient wants to continue.  I again reviewed the whole issue of HRT with her to include the WHI study with increased risk of stroke, heart attack, DVT and breast cancer. The ACOG and NAMS statements for lowest dose for the shortest period of time reviewed. Transdermal versus oral first-pass effect benefit discussed.  Vivelle 0.1 mg patch refill 1 year provided. 2. Osteopenia. DEXA thousand 14 T score -1.9. FRAX 8.6%/1.1%. Vitamin D  54 last year. Plan repeat DEXA next year. Increase calcium and vitamin D recommendations reviewed. 3. History of VAIN 1 2002. Normal Pap smears since.  Pap smear 2014. No Pap smear done today. Plan repeat at 3 year interval. 4. Cystocele/rectocele. Actively being evaluated by urology, Dr. Terrilee FilesMacDermid. She'll continue follow up with him. 5. Mammography 10/2014. Continue with annual mammography. SBE monthly reviewed. 6. Colonoscopy 2013. Repeat at their recommended interval. 7. Health maintenance. No routine lab work done as it is done at Dr. Beverely Lowabori office.  Follow up in one year, sooner as needed.     Cindy Blankenship,Cindy Blankenship, 9:23 AM 11/23/2014

## 2014-11-23 NOTE — Patient Instructions (Signed)
You may obtain a copy of any labs that were done today by logging onto MyChart as outlined in the instructions provided with your AVS (after visit summary). The office will not call with normal lab results but certainly if there are any significant abnormalities then we will contact you.   Health Maintenance, Female A healthy lifestyle and preventative care can promote health and wellness.  Maintain regular health, dental, and eye exams.  Eat a healthy diet. Foods like vegetables, fruits, whole grains, low-fat dairy products, and lean protein foods contain the nutrients you need without too many calories. Decrease your intake of foods high in solid fats, added sugars, and salt. Get information about a proper diet from your caregiver, if necessary.  Regular physical exercise is one of the most important things you can do for your health. Most adults should get at least 150 minutes of moderate-intensity exercise (any activity that increases your heart rate and causes you to sweat) each week. In addition, most adults need muscle-strengthening exercises on 2 or more days a week.   Maintain a healthy weight. The body mass index (BMI) is a screening tool to identify possible weight problems. It provides an estimate of body fat based on height and weight. Your caregiver can help determine your BMI, and can help you achieve or maintain a healthy weight. For adults 20 years and older:  A BMI below 18.5 is considered underweight.  A BMI of 18.5 to 24.9 is normal.  A BMI of 25 to 29.9 is considered overweight.  A BMI of 30 and above is considered obese.  Maintain normal blood lipids and cholesterol by exercising and minimizing your intake of saturated fat. Eat a balanced diet with plenty of fruits and vegetables. Blood tests for lipids and cholesterol should begin at age 61 and be repeated every 5 years. If your lipid or cholesterol levels are high, you are over 50, or you are a high risk for heart  disease, you may need your cholesterol levels checked more frequently.Ongoing high lipid and cholesterol levels should be treated with medicines if diet and exercise are not effective.  If you smoke, find out from your caregiver how to quit. If you do not use tobacco, do not start.  Lung cancer screening is recommended for adults aged 33 80 years who are at high risk for developing lung cancer because of a history of smoking. Yearly low-dose computed tomography (CT) is recommended for people who have at least a 30-pack-year history of smoking and are a current smoker or have quit within the past 15 years. A pack year of smoking is smoking an average of 1 pack of cigarettes a day for 1 year (for example: 1 pack a day for 30 years or 2 packs a day for 15 years). Yearly screening should continue until the smoker has stopped smoking for at least 15 years. Yearly screening should also be stopped for people who develop a health problem that would prevent them from having lung cancer treatment.  If you are pregnant, do not drink alcohol. If you are breastfeeding, be very cautious about drinking alcohol. If you are not pregnant and choose to drink alcohol, do not exceed 1 drink per day. One drink is considered to be 12 ounces (355 mL) of beer, 5 ounces (148 mL) of wine, or 1.5 ounces (44 mL) of liquor.  Avoid use of street drugs. Do not share needles with anyone. Ask for help if you need support or instructions about stopping  the use of drugs.  High blood pressure causes heart disease and increases the risk of stroke. Blood pressure should be checked at least every 1 to 2 years. Ongoing high blood pressure should be treated with medicines, if weight loss and exercise are not effective.  If you are 59 to 64 years old, ask your caregiver if you should take aspirin to prevent strokes.  Diabetes screening involves taking a blood sample to check your fasting blood sugar level. This should be done once every 3  years, after age 91, if you are within normal weight and without risk factors for diabetes. Testing should be considered at a younger age or be carried out more frequently if you are overweight and have at least 1 risk factor for diabetes.  Breast cancer screening is essential preventative care for women. You should practice "breast self-awareness." This means understanding the normal appearance and feel of your breasts and may include breast self-examination. Any changes detected, no matter how small, should be reported to a caregiver. Women in their 66s and 30s should have a clinical breast exam (CBE) by a caregiver as part of a regular health exam every 1 to 3 years. After age 101, women should have a CBE every year. Starting at age 100, women should consider having a mammogram (breast X-ray) every year. Women who have a family history of breast cancer should talk to their caregiver about genetic screening. Women at a high risk of breast cancer should talk to their caregiver about having an MRI and a mammogram every year.  Breast cancer gene (BRCA)-related cancer risk assessment is recommended for women who have family members with BRCA-related cancers. BRCA-related cancers include breast, ovarian, tubal, and peritoneal cancers. Having family members with these cancers may be associated with an increased risk for harmful changes (mutations) in the breast cancer genes BRCA1 and BRCA2. Results of the assessment will determine the need for genetic counseling and BRCA1 and BRCA2 testing.  The Pap test is a screening test for cervical cancer. Women should have a Pap test starting at age 57. Between ages 25 and 35, Pap tests should be repeated every 2 years. Beginning at age 37, you should have a Pap test every 3 years as long as the past 3 Pap tests have been normal. If you had a hysterectomy for a problem that was not cancer or a condition that could lead to cancer, then you no longer need Pap tests. If you are  between ages 50 and 76, and you have had normal Pap tests going back 10 years, you no longer need Pap tests. If you have had past treatment for cervical cancer or a condition that could lead to cancer, you need Pap tests and screening for cancer for at least 20 years after your treatment. If Pap tests have been discontinued, risk factors (such as a new sexual partner) need to be reassessed to determine if screening should be resumed. Some women have medical problems that increase the chance of getting cervical cancer. In these cases, your caregiver may recommend more frequent screening and Pap tests.  The human papillomavirus (HPV) test is an additional test that may be used for cervical cancer screening. The HPV test looks for the virus that can cause the cell changes on the cervix. The cells collected during the Pap test can be tested for HPV. The HPV test could be used to screen women aged 44 years and older, and should be used in women of any age  who have unclear Pap test results. After the age of 55, women should have HPV testing at the same frequency as a Pap test.  Colorectal cancer can be detected and often prevented. Most routine colorectal cancer screening begins at the age of 44 and continues through age 20. However, your caregiver may recommend screening at an earlier age if you have risk factors for colon cancer. On a yearly basis, your caregiver may provide home test kits to check for hidden blood in the stool. Use of a small camera at the end of a tube, to directly examine the colon (sigmoidoscopy or colonoscopy), can detect the earliest forms of colorectal cancer. Talk to your caregiver about this at age 86, when routine screening begins. Direct examination of the colon should be repeated every 5 to 10 years through age 13, unless early forms of pre-cancerous polyps or small growths are found.  Hepatitis C blood testing is recommended for all people born from [birth year] through 1964 and any  individual with known risks for hepatitis C.  Practice safe sex. Use condoms and avoid high-risk sexual practices to reduce the spread of sexually transmitted infections (STIs). Sexually active women aged 36 and younger should be checked for Chlamydia, which is a common sexually transmitted infection. Older women with new or multiple partners should also be tested for Chlamydia. Testing for other STIs is recommended if you are sexually active and at increased risk.  Osteoporosis is a disease in which the bones lose minerals and strength with aging. This can result in serious bone fractures. The risk of osteoporosis can be identified using a bone density scan. Women ages 20 and over and women at risk for fractures or osteoporosis should discuss screening with their caregivers. Ask your caregiver whether you should be taking a calcium supplement or vitamin D to reduce the rate of osteoporosis.  Menopause can be associated with physical symptoms and risks. Hormone replacement therapy is available to decrease symptoms and risks. You should talk to your caregiver about whether hormone replacement therapy is right for you.  Use sunscreen. Apply sunscreen liberally and repeatedly throughout the day. You should seek shade when your shadow is shorter than you. Protect yourself by wearing long sleeves, pants, a wide-brimmed hat, and sunglasses year round, whenever you are outdoors.  Notify your caregiver of new moles or changes in moles, especially if there is a change in shape or color. Also notify your caregiver if a mole is larger than the size of a pencil eraser.  Stay current with your immunizations. Document Released: 05/13/2011 Document Revised: 02/22/2013 Document Reviewed: 05/13/2011 [facility] Patient Information 2014 [employer].

## 2014-11-29 ENCOUNTER — Other Ambulatory Visit: Payer: 59

## 2014-11-29 ENCOUNTER — Encounter: Payer: Self-pay | Admitting: Internal Medicine

## 2014-11-29 ENCOUNTER — Ambulatory Visit (INDEPENDENT_AMBULATORY_CARE_PROVIDER_SITE_OTHER): Payer: 59 | Admitting: Internal Medicine

## 2014-11-29 VITALS — BP 130/70 | HR 82 | Ht 60.0 in | Wt 109.0 lb

## 2014-11-29 DIAGNOSIS — R1084 Generalized abdominal pain: Secondary | ICD-10-CM

## 2014-11-29 DIAGNOSIS — R1013 Epigastric pain: Secondary | ICD-10-CM

## 2014-11-29 NOTE — Progress Notes (Signed)
Cindy Blankenship [birth year] [id number]  Note: This dictation was prepared with Dragon digital system. Any transcriptional errors that result from this procedure are unintentional.   History of Present Illness: This is a 64 year old white female known to me from prior  Colorectal screening exams. She is here today because of   acute episode of upper abdominal pain localized to the left upper quadrant which occurred in October 2015 and lasted about a month. It finally subsided after taking omeprazole 20 mg daily changing her diet to lower carbohydrate and low-fat. She is currently asymptomatic. Her constipation has resolved.Marland Kitchen. Her weight has  remained stable. There is a family history of gallbladder disease in 2 sisters. Upper abdominal ultrasound in January 2010 showed 4 mm polyp. There is a family history of colon cancer in patient's mother. She underwent numerous colonoscopies starting in 1996, 2003, 2008 and in May 2013. There is  a history of endometriosis for which she underwent TAH/BSO in 1996.    Past Medical History  Diagnosis Date  . IBS (irritable bowel syndrome)   . Hyperlipidemia   . Back pain   . Dyslipidemia   . Hemorrhoids with complication   . Allergic rhinitis   . VAIN (vaginal intraepithelial neoplasia) 2002  . Hypothyroidism   . Hypertension   . Osteopenia 01/2013    T score -1.9 FRAX 8.6%/1.1%  . Cystocele   . Rectocele     Past Surgical History  Procedure Laterality Date  . Left oophorectomy    . Abdominal hysterectomy    . Laparoscopy abdomen diagnostic    . Pelvic laparoscopy      Allergies  Allergen Reactions  . Penicillins Shortness Of Breath and Swelling  . Sulfonamide Derivatives Shortness Of Breath and Swelling  . Tetracycline Rash    Family history and social history have been reviewed.  Review of Systems: Currently denies abdominal pain weight loss diarrhea nausea  The remainder of the 10 point ROS is negative except as outlined in the  H&P  Physical Exam: General Appearance thin in no distress Eyes  Non icteric  HEENT  Non traumatic, normocephalic  Mouth No lesion, tongue papillated, no cheilosis Neck Supple without adenopathy, thyroid not enlarged, no carotid bruits, no JVD Lungs Clear to auscultation bilaterally COR Normal S1, normal S2, regular rhythm, no murmur, quiet precordium Abdomen soft with minimal tenderness in epigastrium. Normal active bowel sounds. No distention. Liver edge at costal margin Rectal not done Extremities  No pedal edema Skin No lesions Neurological Alert and oriented x 3 Psychological Normal mood and affect  Assessment and Plan:   64 year old white female with episode of dyspepsia which responded to over-the-counter PPI. She is currently well. There is a family history of gallbladder disease in 2 sisters and personal history of gallbladder polyp. We will obtain upper abdominal ultrasound . She will remain on the low carbohydrate low-fat diet which seemed to benefit her overall health. We will also check sprue profile today.  Positive family history of colon cancer in patient's mother. Recall colonoscopy scheduled for May 2018    Lina SarDora Taiz Bickle @TODAY (<PARAMETER> error)@

## 2014-11-29 NOTE — Patient Instructions (Signed)
Your physician has requested that you go to the basement for the following lab work before leaving today:  Celiac Sprue  You have been scheduled [facility] abdominal [facility] (1st floor of hospital) on 12/01/2014 at 8:00am . Please arrive 15 minutes prior to your appointment for registration. Make certain not to have anything to eat or drink 6 hours prior to your appointment. Should you need to reschedule your appointment, please contact radiology at [phone]. This test typically takes about 30 minutes to perform.  You are due for a recall colonoscopy 03/2017.  You will receive a letter ahead of time reminding you to call and schedule it.

## 2014-11-30 LAB — GLIA (IGA/G) + TTG IGA
GLIADIN IGA: 5 U (ref <20)
Gliadin IgG: 3 Units (ref <20)
Tissue Transglutaminase Ab, IgA: 1 U/mL (ref <4)

## 2014-12-01 ENCOUNTER — Ambulatory Visit (HOSPITAL_COMMUNITY)
Admission: RE | Admit: 2014-12-01 | Discharge: 2014-12-01 | Disposition: A | Discharge status: Home / Self Care | Payer: 59 | Source: Ambulatory Visit | Attending: Internal Medicine | Admitting: Internal Medicine

## 2014-12-01 DIAGNOSIS — R109 Unspecified abdominal pain: Secondary | ICD-10-CM | POA: Insufficient documentation

## 2014-12-01 DIAGNOSIS — K824 Cholesterolosis of gallbladder: Secondary | ICD-10-CM | POA: Diagnosis not present

## 2014-12-01 DIAGNOSIS — R1084 Generalized abdominal pain: Secondary | ICD-10-CM

## 2014-12-01 DIAGNOSIS — K589 Irritable bowel syndrome without diarrhea: Secondary | ICD-10-CM | POA: Insufficient documentation

## 2014-12-14 ENCOUNTER — Other Ambulatory Visit: Payer: Self-pay | Admitting: Family Medicine

## 2014-12-15 NOTE — Telephone Encounter (Signed)
Med filled.  

## 2015-01-23 ENCOUNTER — Other Ambulatory Visit: Payer: Self-pay | Admitting: Urology

## 2015-02-13 ENCOUNTER — Ambulatory Visit: Payer: 59 | Admitting: Family Medicine

## 2015-02-13 ENCOUNTER — Encounter: Payer: Self-pay | Admitting: Family Medicine

## 2015-02-13 ENCOUNTER — Other Ambulatory Visit: Payer: Self-pay | Admitting: General Practice

## 2015-02-13 MED ORDER — HYOSCYAMINE SULFATE ER 0.375 MG PO TB12: ORAL_TABLET | ORAL | Status: DC

## 2015-02-20 ENCOUNTER — Telehealth: Payer: Self-pay | Admitting: Family Medicine

## 2015-02-20 NOTE — Telephone Encounter (Signed)
No charge- she did show up.  And she is being dismissed.

## 2015-02-20 NOTE — Telephone Encounter (Signed)
Pt was late for appt on 02/13/15 and was not able to be seen- chart showing that she exchanged emails with Dr. Beverely Lowabori after appointment requesting to move PCP- no letter sent - charge?

## 2015-02-23 ENCOUNTER — Other Ambulatory Visit (HOSPITAL_COMMUNITY): Payer: Self-pay | Admitting: *Deleted

## 2015-02-23 NOTE — Patient Instructions (Signed)
Cindy Blankenship  02/23/2015   Your procedure is scheduled on: Tuesday 03/07/2015  Report [facility]  Entrance and follow signs to               Short Stay Center at 0530 AM.  Call this number if you have problems the morning of surgery [phone]   Remember: USE FLEET'S ENEMA NIGHT BEFORE SURGERY!   Do not eat food or drink liquids :After Midnight.     Take these medicines the morning of surgery with A SIP OF WATER: Amlodipine, use Flonase nasal spray, Levothyroxine                               You may not have any metal on your body including hair pins and              piercings  Do not wear jewelry, make-up, lotions, powders or perfumes.             Do not wear nail polish.  Do not shave  48 hours prior to surgery.              Men may shave face and neck.   Do not bring valuables to the hospital. [place] IS NOT             RESPONSIBLE   FOR VALUABLES.  Contacts, dentures or bridgework may not be worn into surgery.  Leave suitcase in the car. After surgery it may be brought to your room.     Patients discharged the day of surgery will not be allowed to drive home.  Name and phone number of your driver:  Special Instructions: N/A              Please read over the following fact sheets you were given: _____________________________________________________________________             [facility] Pre[facility] Before surgery, you can play an important role.  Because skin is not sterile, your skin needs to be as free of germs as possible.  You can reduce the number of germs on your skin by washing with CHG (chlorahexidine gluconate) soap before surgery.  CHG is an antiseptic cleaner which kills germs and bonds with the skin to continue killing germs even after washing. Please DO NOT use if you have an allergy to CHG or antibacterial soaps.  If your skin becomes reddened/irritated stop using the CHG and inform your nurse when you arrive  at Short Stay. Do not shave (including legs and underarms) for at least 48 hours prior to the first CHG shower.  You may shave your face/neck. Please follow these instructions carefully:  1.  Shower with CHG Soap the night before surgery and the  morning of Surgery.  2.  If you choose to wash your hair, wash your hair first as usual with your  normal  shampoo.  3.  After you shampoo, rinse your hair and body thoroughly to remove the  shampoo.                           4.  Use CHG as you would any other liquid soap.  You can apply chg directly  to the skin and wash  Gently with a scrungie or clean washcloth.  5.  Apply the CHG Soap to your body ONLY FROM THE NECK DOWN.   Do not use on face/ open                           Wound or open sores. Avoid contact with eyes, ears mouth and genitals (private parts).                       Wash face,  Genitals (private parts) with your normal soap.             6.  Wash thoroughly, paying special attention to the area where your surgery  will be performed.  7.  Thoroughly rinse your body with warm water from the neck down.  8.  DO NOT shower/wash with your normal soap after using and rinsing off  the CHG Soap.                9.  Pat yourself dry with a clean towel.            10.  Wear clean pajamas.            11.  Place clean sheets on your bed the night of your first shower and do not  sleep with pets. Day of Surgery : Do not apply any lotions/deodorants the morning of surgery.  Please wear clean clothes to the hospital/surgery center.  FAILURE TO FOLLOW THESE INSTRUCTIONS MAY RESULT IN THE CANCELLATION OF YOUR SURGERY PATIENT SIGNATURE_________________________________  NURSE SIGNATURE__________________________________  ________________________________________________________________________   Adam Phenix  An incentive spirometer is a tool that can help keep your lungs clear and active. This tool measures how well you  are filling your lungs with each breath. Taking long deep breaths may help reverse or decrease the chance of developing breathing (pulmonary) problems (especially infection) following:  A long period of time when you are unable to move or be active. BEFORE THE PROCEDURE   If the spirometer includes an indicator to show your best effort, your nurse or respiratory therapist will set it to a desired goal.  If possible, sit up straight or lean slightly forward. Try not to slouch.  Hold the incentive spirometer in an upright position. INSTRUCTIONS FOR USE   Sit on the edge of your bed if possible, or sit up as far as you can in bed or on a chair.  Hold the incentive spirometer in an upright position.  Breathe out normally.  Place the mouthpiece in your mouth and seal your lips tightly around it.  Breathe in slowly and as deeply as possible, raising the piston or the ball toward the top of the column.  Hold your breath for 3-5 seconds or for as long as possible. Allow the piston or ball to fall to the bottom of the column.  Remove the mouthpiece from your mouth and breathe out normally.  Rest for a few seconds and repeat Steps 1 through 7 at least 10 times every 1-2 hours when you are awake. Take your time and take a few normal breaths between deep breaths.  The spirometer may include an indicator to show your best effort. Use the indicator as a goal to work toward during each repetition.  After each set of 10 deep breaths, practice coughing to be sure your lungs are clear. If you have an incision (the cut made at the time of surgery),  support your incision when coughing by placing a pillow or rolled up towels firmly against it. Once you are able to get out of bed, walk around indoors and cough well. You may stop using the incentive spirometer when instructed by your caregiver.  RISKS AND COMPLICATIONS  Take your time so you do not get dizzy or light-headed.  If you are in pain, you may  need to take or ask for pain medication before doing incentive spirometry. It is harder to take a deep breath if you are having pain. AFTER USE  Rest and breathe slowly and easily.  It can be helpful to keep track of a log of your progress. Your caregiver can provide you with a simple table to help with this. If you are using the spirometer at home, follow these instructions: [place] IF:   You are having difficultly using the spirometer.  You have trouble using the spirometer as often as instructed.  Your pain medication is not giving enough relief while using the spirometer.  You develop fever of 100.5 F (38.1 C) or higher. SEEK IMMEDIATE MEDICAL CARE IF:   You cough up bloody sputum that had not been present before.  You develop fever of 102 F (38.9 C) or greater.  You develop worsening pain at or near the incision site. MAKE SURE YOU:   Understand these instructions.  Will watch your condition.  Will get help right away if you are not doing well or get worse. Document Released: 03/10/2007 Document Revised: 01/20/2012 Document Reviewed: 05/11/2007 ExitCare Patient Information 2014 ExitCare, [place].   ________________________________________________________________________  WHAT IS A BLOOD TRANSFUSION? Blood Transfusion Information  A transfusion is the replacement of blood or some of its parts. Blood is made up of multiple cells which provide different functions.  Red blood cells carry oxygen and are used for blood loss replacement.  White blood cells fight against infection.  Platelets control bleeding.  Plasma helps clot blood.  Other blood products are available for specialized needs, such as hemophilia or other clotting disorders. BEFORE THE TRANSFUSION  Who gives blood for transfusions?   Healthy volunteers who are fully evaluated to make sure their blood is safe. This is blood bank blood. Transfusion therapy is the safest it has ever been in  the practice of medicine. Before blood is taken from a donor, a complete history is taken to make sure that person has no history of diseases nor engages in risky social behavior (examples are intravenous drug use or sexual activity with multiple partners). The donor's travel history is screened to minimize risk of transmitting infections, such as malaria. The donated blood is tested for signs of infectious diseases, such as HIV and hepatitis. The blood is then tested to be sure it is compatible with you in order to minimize the chance of a transfusion reaction. If you or a relative donates blood, this is often done in anticipation of surgery and is not appropriate for emergency situations. It takes many days to process the donated blood. RISKS AND COMPLICATIONS Although transfusion therapy is very safe and saves many lives, the main dangers of transfusion include:   Getting an infectious disease.  Developing a transfusion reaction. This is an allergic reaction to something in the blood you were given. Every precaution is taken to prevent this. The decision to have a blood transfusion has been considered carefully by your caregiver before blood is given. Blood is not given unless the benefits outweigh the risks. AFTER THE TRANSFUSION  Right after receiving a blood transfusion, you will usually feel much better and more energetic. This is especially true if your red blood cells have gotten low (anemic). The transfusion raises the level of the red blood cells which carry oxygen, and this usually causes an energy increase.  The nurse administering the transfusion will monitor you carefully for complications. HOME CARE INSTRUCTIONS  No special instructions are needed after a transfusion. You may find your energy is better. Speak with your caregiver about any limitations on activity for underlying diseases you may have. SEEK MEDICAL CARE IF:   Your condition is not improving after your  transfusion.  You develop redness or irritation at the intravenous (IV) site. SEEK IMMEDIATE MEDICAL CARE IF:  Any of the following symptoms occur over the next 12 hours:  Shaking chills.  You have a temperature by mouth above 102 F (38.9 C), not controlled by medicine.  Chest, back, or muscle pain.  People around you feel you are not acting correctly or are confused.  Shortness of breath or difficulty breathing.  Dizziness and fainting.  You get a rash or develop hives.  You have a decrease in urine output.  Your urine turns a dark color or changes to pink, red, or brown. Any of the following symptoms occur over the next 10 days:  You have a temperature by mouth above 102 F (38.9 C), not controlled by medicine.  Shortness of breath.  Weakness after normal activity.  The white part of the eye turns yellow (jaundice).  You have a decrease in the amount of urine or are urinating less often.  Your urine turns a dark color or changes to pink, red, or brown. Document Released: 10/25/2000 Document Revised: 01/20/2012 Document Reviewed: 06/13/2008 [facility] Patient Information 2014 [place], [place].  _______________________________________________________________________

## 2015-02-24 ENCOUNTER — Encounter (HOSPITAL_COMMUNITY)
Admission: RE | Admit: 2015-02-24 | Discharge: 2015-02-24 | Disposition: A | Discharge status: Home / Self Care | Payer: 59 | Source: Ambulatory Visit | Attending: Urology | Admitting: Urology

## 2015-02-24 ENCOUNTER — Encounter (HOSPITAL_COMMUNITY): Payer: Self-pay

## 2015-02-24 DIAGNOSIS — Z01818 Encounter for other preprocedural examination: Secondary | ICD-10-CM | POA: Insufficient documentation

## 2015-02-24 HISTORY — DX: Pneumonia, unspecified organism: J18.9

## 2015-02-24 LAB — CBC
HEMATOCRIT: 44.7 % (ref 36.0–46.0)
Hemoglobin: 14.8 g/dL (ref 12.0–15.0)
MCH: 28.5 pg (ref 26.0–34.0)
MCHC: 33.1 g/dL (ref 30.0–36.0)
MCV: 86.1 fL (ref 78.0–100.0)
Platelets: 233 10*3/uL (ref 150–400)
RBC: 5.19 MIL/uL — ABNORMAL HIGH (ref 3.87–5.11)
RDW: 12.6 % (ref 11.5–15.5)
WBC: 4.7 10*3/uL (ref 4.0–10.5)

## 2015-02-24 LAB — APTT: aPTT: 35 seconds (ref 24–37)

## 2015-02-24 LAB — PROTIME-INR
INR: 0.94 (ref 0.00–1.49)
PROTHROMBIN TIME: 12.7 s (ref 11.6–15.2)

## 2015-03-06 MED ORDER — DEXTROSE 5 % IV SOLN
5.0000 mg/kg | INTRAVENOUS | Status: AC
  Administered 2015-03-07: 240 mg via INTRAVENOUS
  Filled 2015-03-06: qty 6

## 2015-03-06 NOTE — H&P (Signed)
History of Present Illness   I was consulted by Dr Audie Box regarding Cindy Blankenship's pelvic organ prolapse with symptoms worsening over the last several months to 1 year. She can feel vaginal bulging and sometimes it is worse than other times. She mainly reduces it. She will lean forward sometimes to urinate, but there is no splinting. She has had a hysterectomy. She has irritable bowel syndrome with constipation and diarrhea.   She has no neurologic risk factors or symptoms. She is continent and her bladder is not very urgent. She voids every hour and sometimes gets up once a night. Her flow is poor to good.     When I saw Cindy Blankenship last time she reported some vaginal dryness and dyspareunia. She had a mild grade 2 cystocele that reached the introitus associated with a moderate central defect. Her tissues are somewhat elastic as noted. The cystocele actually reached the introitus. The vaginal cuff distended approximately 3 cm. She had a mild low grade 2 rectocele and a negative cough test. She has intermittent nocturia and some increased frequency.  I thought if she ever had surgery she would likely best benefit from a transvaginal vault suspension with cystocele repair and graft and intraoperative decision whether or not a rectocele repair would help her.   She is here to discuss urodynamics.  She did not void and was catheterized for 50 mL. Maximum capacity was 500 mL. Bladder was stable. At 500 mL at 40 cm of water she had very mild leakage. At higher pressures she had no leaks prior to this. She has very mild leakage at 200 mL with the prolapse reduced. At 300 mL she had mild leakage with Valsalva pressure at 52 cm of water. During voluntary voiding she did generate through the contraction at 9 cm of water and she was attempting to void for a long while. She did not void. EMG activity increased mildly during the voiding phase. Fluoroscopically, her bladder neck distended 2-3 cm and she had a  moderate cystocele at most. The details of the urodynamics were signed and dictated on the urodynamics sheet.    Past Medical History Problems  1. History of esophageal reflux (Z87.19) 2. History of hypercholesterolemia (Z86.39) 3. History of hypertension (Z86.79) 4. History of hypothyroidism (Z86.39)  Surgical History Problems  1. History of Hysterectomy  Current Meds 1. Ambien 5 MG Oral Tablet;  Therapy: (Recorded:07Dec2015) to Recorded 2. Aspirin 81 MG Oral Tablet;  Therapy: (Recorded:07Dec2015) to Recorded 3. Calcium + D TABS;  Therapy: (Recorded:07Dec2015) to Recorded 4. Flonase 50 MCG/ACT SUSP;  Therapy: (Recorded:07Dec2015) to Recorded 5. HydroDiuril 25 MG TABS;  Therapy: (Recorded:07Dec2015) to Recorded 6. Levbid 0.375 MG Oral Tablet Extended Release 12 Hour;  Therapy: (Recorded:07Dec2015) to Recorded 7. Multi-Vitamin TABS;  Therapy: (Recorded:07Dec2015) to Recorded 8. Norvasc 5 MG Oral Tablet;  Therapy: (Recorded:07Dec2015) to Recorded 9. Synthroid 75 MCG Oral Tablet;  Therapy: (Recorded:07Dec2015) to Recorded 10. Vivelle-Dot 0.05 MG/24HR Transdermal Patch Twice Weekly;   Therapy: (Recorded:07Dec2015) to Recorded 11. Zocor 20 MG Oral Tablet;   Therapy: (Recorded:07Dec2015) to Recorded 12. Zyrtec 10 MG TABS;   Therapy: (Recorded:07Dec2015) to Recorded  Allergies Medication  1. Penicillins 2. Sulfa Drugs 3. Tetracyclines  Family History Problems  1. Family history of Colon cancer : Mother 2. Family history of Death of family member : Father   father passed @ age 2-cardiac arrest 3. Family history of cardiac arrest (Z82.49) : Father 4. Family history of cardiac disorder (Z82.49) : Mother 5. Family history  of kidney stones (Z84.1) : Daughter  Social History Problems  1. Alcohol use (Z78.9)   occasionally 2. Caffeine use (F15.90)   1 drink daily 3. Former smoker [phone]) 4. History of Former smoker [phone])   smoked for 8 yearsquit smoking 30  years ago 5. Married 6. Number of children   1 son1 daughter 7. Occupation   paralegal  Assessment Assessed  1. Cystocele, midline (N81.11)  Plan Cystocele, midline  1. Follow-up Schedule Surgery Office  Follow-up  Status: Complete  Done: 09Mar2016 2. Follow-up Schedule Surgery Office  Follow-up  Status: Complete  Done: 09Mar2016  Discussion/Summary   I drew Cindy Blankenship a picture. In regard to her prolapse, I talked to her about a transvaginal vault suspension with cystocele repair and graft and possible rectocele repair with _____ pessary versus watchful waiting.  I drew her a picture and we talked about prolapse surgery in detail. Pros, cons, general surgical and anesthetic risks, and other options including behavioral therapy, pessaries, and watchful waiting were discussed. She understands that prolapse repairs are successful in 80-85% of cases for prolapse symptoms and can recur anteriorly, posteriorly, and/or apically. She understands that in most cases I use a graft and general risks were discussed. Surgical risks were described but not limited to the discussion of injury to neighboring structures including the bowel (with possible life-threatening sepsis and colostomy), bladder, urethra, vagina (all resulting in further surgery), and ureter (resulting in re-implantation). We talked about injury to nerves/soft tissue leading to debilitating and intractable pelvic, abdominal, and lower extremity pain syndromes and neuropathies. The risks of buttock pain, intractable dyspareunia, and vaginal narrowing and shortening with sequelae were discussed. Bleeding risks, transfusion rates, and infection were discussed. The risk of persistent, de novo, or worsening bladder and/or bowel incontinence/dysfunction was discussed. The need for CIC was described as well the usual post-operative course. The patient understands that she might not reach her treatment goal and that she might be worse following  surgery.  I talked to her about her _____ mild stress incontinence. We talked about conservative measures and watchful waiting versus sling.  We talked about a sling in detail. Pros, cons, general surgical and anesthetic risks, and other options including behavioral therapy and watchful waiting were discussed. She understands that slings are generally successful in 90% of cases for stress incontinence, 50% for urge incontinence, and that in a small percentage of cases the incontinence can worsen. The risk of persistent, de novo, or worsening incontinence/dysfunction was discussed. Risks were described but not limited to the discussion of injury to neighboring structures including the bowel (with possible life-threatening sepsis and colostomy), bladder, urethra, vagina (all resulting in further surgery), and ureter (resulting in re-implantation). We also talked about the risk of retention requiring urethrolysis, extrusion requiring revision, and erosion resulting in further surgery. Bleeding risks and transfusion rates and the risk of infection were discussed. The risk of pelvic and abdominal pain syndromes, dyspareunia, and neuropathies were discussed. The need for CIC was described as well as the usual postoperative course. The patient understands that she might not reach her treatment goal and that she might be worse following surgery. Mesh TV issues were discussed.   Cindy Blankenship with some of my help we both agreed not to proceed with a sling. She uses lubricant and I did address again dyspareunia and prolapse and vaginal dryness. She would like to proceed with a prolapse surgery. She is working as a IT consultant at [employer].  Hopefully, the surgery  will reach her treatment goal. Mesh issues discussed.  After a thorough review of the management options for the patient's condition the patient  elected to proceed with surgical therapy as noted above. We have discussed the potential  benefits and risks of the procedure, side effects of the proposed treatment, the likelihood of the patient achieving the goals of the procedure, and any potential problems that might occur during the procedure or recuperation. Informed consent has been obtained.

## 2015-03-07 ENCOUNTER — Encounter (HOSPITAL_COMMUNITY): Payer: Self-pay | Admitting: Certified Registered Nurse Anesthetist

## 2015-03-07 ENCOUNTER — Encounter (HOSPITAL_COMMUNITY)
Admission: RE | Disposition: A | Discharge status: Home / Self Care | Payer: Self-pay | Source: Ambulatory Visit | Attending: Urology

## 2015-03-07 ENCOUNTER — Observation Stay (HOSPITAL_COMMUNITY)
Admission: RE | Admit: 2015-03-07 | Discharge: 2015-03-08 | Disposition: A | Discharge status: Home / Self Care | Payer: 59 | Source: Ambulatory Visit | Attending: Urology | Admitting: Urology

## 2015-03-07 ENCOUNTER — Ambulatory Visit (HOSPITAL_COMMUNITY): Payer: 59 | Admitting: Certified Registered Nurse Anesthetist

## 2015-03-07 DIAGNOSIS — Z880 Allergy status to penicillin: Secondary | ICD-10-CM | POA: Insufficient documentation

## 2015-03-07 DIAGNOSIS — E039 Hypothyroidism, unspecified: Secondary | ICD-10-CM | POA: Diagnosis not present

## 2015-03-07 DIAGNOSIS — K219 Gastro-esophageal reflux disease without esophagitis: Secondary | ICD-10-CM | POA: Insufficient documentation

## 2015-03-07 DIAGNOSIS — IMO0002 Reserved for concepts with insufficient information to code with codable children: Secondary | ICD-10-CM | POA: Diagnosis present

## 2015-03-07 DIAGNOSIS — N816 Rectocele: Secondary | ICD-10-CM | POA: Insufficient documentation

## 2015-03-07 DIAGNOSIS — Z881 Allergy status to other antibiotic agents status: Secondary | ICD-10-CM | POA: Insufficient documentation

## 2015-03-07 DIAGNOSIS — Z87891 Personal history of nicotine dependence: Secondary | ICD-10-CM | POA: Insufficient documentation

## 2015-03-07 DIAGNOSIS — N8110 Cystocele, unspecified: Secondary | ICD-10-CM | POA: Diagnosis present

## 2015-03-07 DIAGNOSIS — K580 Irritable bowel syndrome with diarrhea: Secondary | ICD-10-CM | POA: Insufficient documentation

## 2015-03-07 DIAGNOSIS — N8111 Cystocele, midline: Principal | ICD-10-CM | POA: Insufficient documentation

## 2015-03-07 DIAGNOSIS — Z7982 Long term (current) use of aspirin: Secondary | ICD-10-CM | POA: Diagnosis not present

## 2015-03-07 DIAGNOSIS — E780 Pure hypercholesterolemia: Secondary | ICD-10-CM | POA: Insufficient documentation

## 2015-03-07 DIAGNOSIS — I10 Essential (primary) hypertension: Secondary | ICD-10-CM | POA: Diagnosis not present

## 2015-03-07 DIAGNOSIS — Z882 Allergy status to sulfonamides status: Secondary | ICD-10-CM | POA: Diagnosis not present

## 2015-03-07 DIAGNOSIS — Z90710 Acquired absence of both cervix and uterus: Secondary | ICD-10-CM | POA: Diagnosis not present

## 2015-03-07 HISTORY — PX: ANTERIOR AND POSTERIOR REPAIR: SHX5121

## 2015-03-07 HISTORY — PX: VAGINAL PROLAPSE REPAIR: SHX830

## 2015-03-07 LAB — BASIC METABOLIC PANEL
ANION GAP: 9 (ref 5–15)
BUN: 12 mg/dL (ref 6–23)
CO2: 30 mmol/L (ref 19–32)
CREATININE: 0.45 mg/dL — AB (ref 0.50–1.10)
Calcium: 10 mg/dL (ref 8.4–10.5)
Chloride: 101 mmol/L (ref 96–112)
GFR calc non Af Amer: >90 mL/min (ref >90)
Glucose, Bld: 111 mg/dL — ABNORMAL HIGH (ref 70–99)
Potassium: 4 mmol/L (ref 3.5–5.1)
Sodium: 140 mmol/L (ref 135–145)

## 2015-03-07 LAB — HEMOGLOBIN AND HEMATOCRIT, BLOOD
HCT: 38.4 % (ref 36.0–46.0)
HEMOGLOBIN: 12.8 g/dL (ref 12.0–15.0)

## 2015-03-07 LAB — TYPE AND SCREEN
ABO/RH(D): O POS
Antibody Screen: NEGATIVE

## 2015-03-07 LAB — ABO/RH: ABO/RH(D): O POS

## 2015-03-07 SURGERY — ANTERIOR (CYSTOCELE) AND POSTERIOR REPAIR (RECTOCELE)
Anesthesia: General

## 2015-03-07 MED ORDER — ONDANSETRON HCL 4 MG/2ML IJ SOLN
INTRAMUSCULAR | Status: AC
  Filled 2015-03-07: qty 2

## 2015-03-07 MED ORDER — ROCURONIUM BROMIDE 100 MG/10ML IV SOLN: INTRAVENOUS | Status: DC | PRN

## 2015-03-07 MED ORDER — LIDOCAINE HCL (CARDIAC) 20 MG/ML IV SOLN
INTRAVENOUS | Status: DC | PRN
  Administered 2015-03-07: 50 mg via INTRAVENOUS

## 2015-03-07 MED ORDER — HYDROCODONE-ACETAMINOPHEN 5-325 MG PO TABS
1.0000 | ORAL_TABLET | ORAL | Status: DC | PRN
  Administered 2015-03-07 (×2): 1 via ORAL
  Administered 2015-03-08: 2 via ORAL
  Filled 2015-03-07 (×2): qty 1
  Filled 2015-03-07: qty 2

## 2015-03-07 MED ORDER — ROCURONIUM BROMIDE 100 MG/10ML IV SOLN
INTRAVENOUS | Status: AC
  Filled 2015-03-07: qty 1

## 2015-03-07 MED ORDER — POLYMYXIN B SULFATE 500000 UNITS IJ SOLR
INTRAMUSCULAR | Status: DC | PRN
  Administered 2015-03-07: 500 mL

## 2015-03-07 MED ORDER — HYOSCYAMINE SULFATE ER 0.375 MG PO TB12
0.3750 mg | ORAL_TABLET | Freq: Two times a day (BID) | ORAL | Status: DC | PRN
  Filled 2015-03-07: qty 1

## 2015-03-07 MED ORDER — EPHEDRINE SULFATE 50 MG/ML IJ SOLN
INTRAMUSCULAR | Status: DC | PRN
  Administered 2015-03-07: 5 mg via INTRAVENOUS

## 2015-03-07 MED ORDER — FENTANYL CITRATE (PF) 250 MCG/5ML IJ SOLN
INTRAMUSCULAR | Status: AC
  Filled 2015-03-07: qty 5

## 2015-03-07 MED ORDER — LIDOCAINE-EPINEPHRINE (PF) 1 %-1:200000 IJ SOLN
INTRAMUSCULAR | Status: AC
  Filled 2015-03-07: qty 60

## 2015-03-07 MED ORDER — OXYCODONE HCL 5 MG PO TABS: 5.0000 mg | ORAL_TABLET | Freq: Once | ORAL | Status: DC | PRN

## 2015-03-07 MED ORDER — LIDOCAINE HCL (CARDIAC) 20 MG/ML IV SOLN
INTRAVENOUS | Status: AC
  Filled 2015-03-07: qty 5

## 2015-03-07 MED ORDER — ONDANSETRON HCL 4 MG/2ML IJ SOLN: 4.0000 mg | Freq: Four times a day (QID) | INTRAMUSCULAR | Status: DC | PRN

## 2015-03-07 MED ORDER — DEXAMETHASONE SODIUM PHOSPHATE 10 MG/ML IJ SOLN
INTRAMUSCULAR | Status: DC | PRN
Start: 2015-03-07 — End: 2015-03-07
  Administered 2015-03-07: 10 mg via INTRAVENOUS

## 2015-03-07 MED ORDER — HYDROMORPHONE HCL 2 MG/ML IJ SOLN
INTRAMUSCULAR | Status: AC
  Filled 2015-03-07: qty 1

## 2015-03-07 MED ORDER — SODIUM CHLORIDE 0.9 % IJ SOLN
INTRAMUSCULAR | Status: AC
  Filled 2015-03-07: qty 10

## 2015-03-07 MED ORDER — PROPOFOL 10 MG/ML IV BOLUS
INTRAVENOUS | Status: AC
  Filled 2015-03-07: qty 20

## 2015-03-07 MED ORDER — METRONIDAZOLE IN NACL 5-0.79 MG/ML-% IV SOLN
INTRAVENOUS | Status: AC
  Filled 2015-03-07: qty 100

## 2015-03-07 MED ORDER — DEXAMETHASONE SODIUM PHOSPHATE 10 MG/ML IJ SOLN
INTRAMUSCULAR | Status: AC
  Filled 2015-03-07: qty 1

## 2015-03-07 MED ORDER — SODIUM CHLORIDE 0.9 % IR SOLN
Status: AC
  Filled 2015-03-07: qty 1

## 2015-03-07 MED ORDER — HYDROMORPHONE HCL 1 MG/ML IJ SOLN: 0.2500 mg | INTRAMUSCULAR | Status: DC | PRN

## 2015-03-07 MED ORDER — STERILE WATER FOR IRRIGATION IR SOLN
Status: DC | PRN
  Administered 2015-03-07: 1000 mL

## 2015-03-07 MED ORDER — SIMVASTATIN 20 MG PO TABS
20.0000 mg | ORAL_TABLET | Freq: Every day | ORAL | Status: DC
  Administered 2015-03-07: 20 mg via ORAL
  Filled 2015-03-07: qty 1

## 2015-03-07 MED ORDER — GLYCOPYRROLATE 0.2 MG/ML IJ SOLN
INTRAMUSCULAR | Status: DC | PRN
  Administered 2015-03-07: 0.4 mg via INTRAVENOUS

## 2015-03-07 MED ORDER — PROPOFOL 10 MG/ML IV BOLUS
INTRAVENOUS | Status: DC | PRN
  Administered 2015-03-07: 120 mg via INTRAVENOUS

## 2015-03-07 MED ORDER — NEOSTIGMINE METHYLSULFATE 10 MG/10ML IV SOLN
INTRAVENOUS | Status: DC | PRN
  Administered 2015-03-07: 2 mg via INTRAVENOUS

## 2015-03-07 MED ORDER — HYDROMORPHONE HCL 1 MG/ML IJ SOLN
INTRAMUSCULAR | Status: DC | PRN
  Administered 2015-03-07: 1 mg via INTRAVENOUS

## 2015-03-07 MED ORDER — ONDANSETRON HCL 4 MG/2ML IJ SOLN
INTRAMUSCULAR | Status: DC | PRN
Start: 2015-03-07 — End: 2015-03-07
  Administered 2015-03-07: 4 mg via INTRAVENOUS

## 2015-03-07 MED ORDER — NEOSTIGMINE METHYLSULFATE 10 MG/10ML IV SOLN
INTRAVENOUS | Status: AC
  Filled 2015-03-07: qty 1

## 2015-03-07 MED ORDER — DEXTROSE-NACL 5-0.45 % IV SOLN
INTRAVENOUS | Status: DC
  Administered 2015-03-07 – 2015-03-08 (×3): via INTRAVENOUS

## 2015-03-07 MED ORDER — LACTATED RINGERS IV SOLN
INTRAVENOUS | Status: DC | PRN
  Administered 2015-03-07 (×2): via INTRAVENOUS

## 2015-03-07 MED ORDER — HYDROCHLOROTHIAZIDE 25 MG PO TABS
25.0000 mg | ORAL_TABLET | Freq: Every day | ORAL | Status: DC
  Administered 2015-03-07 – 2015-03-08 (×2): 25 mg via ORAL
  Filled 2015-03-07 (×2): qty 1

## 2015-03-07 MED ORDER — OXYCODONE HCL 5 MG/5ML PO SOLN: 5.0000 mg | Freq: Once | ORAL | Status: DC | PRN

## 2015-03-07 MED ORDER — EPHEDRINE SULFATE 50 MG/ML IJ SOLN
INTRAMUSCULAR | Status: AC
  Filled 2015-03-07: qty 1

## 2015-03-07 MED ORDER — LIDOCAINE-EPINEPHRINE (PF) 1 %-1:200000 IJ SOLN
INTRAMUSCULAR | Status: DC | PRN
  Administered 2015-03-07: 20 mL

## 2015-03-07 MED ORDER — MORPHINE SULFATE 2 MG/ML IJ SOLN
2.0000 mg | INTRAMUSCULAR | Status: DC | PRN
  Filled 2015-03-07: qty 1

## 2015-03-07 MED ORDER — METRONIDAZOLE IN NACL 5-0.79 MG/ML-% IV SOLN
INTRAVENOUS | Status: DC | PRN
  Administered 2015-03-07: 500 mg via INTRAVENOUS

## 2015-03-07 MED ORDER — ESTRADIOL 0.1 MG/GM VA CREA
TOPICAL_CREAM | VAGINAL | Status: AC
  Filled 2015-03-07: qty 85

## 2015-03-07 MED ORDER — FENTANYL CITRATE (PF) 100 MCG/2ML IJ SOLN
INTRAMUSCULAR | Status: DC | PRN
  Administered 2015-03-07 (×2): 50 ug via INTRAVENOUS
  Administered 2015-03-07: 100 ug via INTRAVENOUS

## 2015-03-07 MED ORDER — LACTATED RINGERS IV SOLN
INTRAVENOUS | Status: DC
  Administered 2015-03-07: 11:00:00 via INTRAVENOUS

## 2015-03-07 MED ORDER — FLEET ENEMA 7-19 GM/118ML RE ENEM: 1.0000 | ENEMA | Freq: Once | RECTAL | Status: DC

## 2015-03-07 MED ORDER — ROCURONIUM BROMIDE 100 MG/10ML IV SOLN
INTRAVENOUS | Status: DC | PRN
  Administered 2015-03-07: 20 mg via INTRAVENOUS
  Administered 2015-03-07: 5 mg via INTRAVENOUS

## 2015-03-07 MED ORDER — AMLODIPINE BESYLATE 5 MG PO TABS
5.0000 mg | ORAL_TABLET | Freq: Every day | ORAL | Status: DC
  Administered 2015-03-07: 5 mg via ORAL
  Filled 2015-03-07: qty 1

## 2015-03-07 MED ORDER — MIDAZOLAM HCL 2 MG/2ML IJ SOLN
INTRAMUSCULAR | Status: AC
  Filled 2015-03-07: qty 2

## 2015-03-07 MED ORDER — PHENAZOPYRIDINE HCL 200 MG PO TABS
200.0000 mg | ORAL_TABLET | Freq: Once | ORAL | Status: AC
  Administered 2015-03-07: 200 mg via ORAL
  Filled 2015-03-07: qty 1

## 2015-03-07 MED ORDER — GLYCOPYRROLATE 0.2 MG/ML IJ SOLN
INTRAMUSCULAR | Status: AC
  Filled 2015-03-07: qty 2

## 2015-03-07 MED ORDER — MIDAZOLAM HCL 5 MG/5ML IJ SOLN
INTRAMUSCULAR | Status: DC | PRN
  Administered 2015-03-07: 2 mg via INTRAVENOUS

## 2015-03-07 MED ORDER — ACETAMINOPHEN 325 MG PO TABS: 650.0000 mg | ORAL_TABLET | ORAL | Status: DC | PRN

## 2015-03-07 MED ORDER — LEVOTHYROXINE SODIUM 25 MCG PO TABS
75.0000 ug | ORAL_TABLET | Freq: Every day | ORAL | Status: DC
  Administered 2015-03-08: 75 ug via ORAL
  Filled 2015-03-07: qty 3

## 2015-03-07 MED ORDER — ESTRADIOL 0.1 MG/GM VA CREA
TOPICAL_CREAM | VAGINAL | Status: DC | PRN
  Administered 2015-03-07: 1 via VAGINAL

## 2015-03-07 MED ORDER — SUCCINYLCHOLINE CHLORIDE 20 MG/ML IJ SOLN
INTRAMUSCULAR | Status: DC | PRN
  Administered 2015-03-07: 100 mg via INTRAVENOUS

## 2015-03-07 MED ORDER — ONDANSETRON HCL 4 MG/2ML IJ SOLN
4.0000 mg | INTRAMUSCULAR | Status: DC | PRN
  Administered 2015-03-08: 4 mg via INTRAVENOUS
  Filled 2015-03-07: qty 2

## 2015-03-07 MED ORDER — HYDROCODONE-ACETAMINOPHEN 5-325 MG PO TABS: 1.0000 | ORAL_TABLET | Freq: Four times a day (QID) | ORAL | Status: DC | PRN

## 2015-03-07 SURGICAL SUPPLY — 56 items
ALLOGRAFT TUTOPLAST AXIS 6X12 (Tissue) ×1 IMPLANT
BAG URINE DRAINAGE (UROLOGICAL SUPPLIES) ×3 IMPLANT
BLADE HEX COATED 2.75 (ELECTRODE) ×3 IMPLANT
BLADE SURG 15 STRL LF DISP TIS (BLADE) ×2 IMPLANT
BLADE SURG 15 STRL SS (BLADE) ×6
CATH FOLEY 2WAY SLVR  5CC 14FR (CATHETERS) ×2
CATH FOLEY 2WAY SLVR  5CC 16FR (CATHETERS) ×2
CATH FOLEY 2WAY SLVR 5CC 14FR (CATHETERS) ×1 IMPLANT
CATH FOLEY 2WAY SLVR 5CC 16FR (CATHETERS) ×1 IMPLANT
COVER MAYO STAND STRL (DRAPES) IMPLANT
COVER SURGICAL LIGHT HANDLE (MISCELLANEOUS) ×3 IMPLANT
DECANTER SPIKE VIAL GLASS SM (MISCELLANEOUS) ×1 IMPLANT
DEVICE CAPIO SLIM SINGLE (INSTRUMENTS) ×6 IMPLANT
DRAIN PENROSE 18X1/4 LTX STRL (WOUND CARE) ×3 IMPLANT
ELECT REM PT RETURN 9FT ADLT (ELECTROSURGICAL) ×3
ELECTRODE REM PT RTRN 9FT ADLT (ELECTROSURGICAL) ×1 IMPLANT
GAUZE PACKING 2X5 YD STRL (GAUZE/BANDAGES/DRESSINGS) ×3 IMPLANT
GAUZE SPONGE 4X4 16PLY XRAY LF (GAUZE/BANDAGES/DRESSINGS) ×10 IMPLANT
GLOVE BIO SURGEON STRL SZ 6.5 (GLOVE) ×2 IMPLANT
GLOVE BIO SURGEONS STRL SZ 6.5 (GLOVE) ×1
GLOVE BIOGEL M STRL SZ7.5 (GLOVE) ×3 IMPLANT
GLOVE ECLIPSE 8.5 STRL (GLOVE) ×8 IMPLANT
GOWN STRL REUS W/TWL LRG LVL3 (GOWN DISPOSABLE) ×6 IMPLANT
GOWN STRL REUS W/TWL XL LVL3 (GOWN DISPOSABLE) ×3 IMPLANT
HOLDER FOLEY CATH W/STRAP (MISCELLANEOUS) ×3 IMPLANT
IV NS 1000ML (IV SOLUTION)
IV NS 1000ML BAXH (IV SOLUTION) ×1 IMPLANT
KIT BASIN OR (CUSTOM PROCEDURE TRAY) ×3 IMPLANT
NEEDLE HYPO 22GX1.5 SAFETY (NEEDLE) ×3 IMPLANT
NEEDLE MAYO 6 CRC TAPER PT (NEEDLE) ×6 IMPLANT
NS IRRIG 1000ML POUR BTL (IV SOLUTION) IMPLANT
PACK CYSTO (CUSTOM PROCEDURE TRAY) ×3 IMPLANT
PENCIL BUTTON HOLSTER BLD 10FT (ELECTRODE) ×3 IMPLANT
PLUG CATH AND CAP STER (CATHETERS) ×3 IMPLANT
POSITIONER SURGICAL ARM (MISCELLANEOUS) ×3 IMPLANT
RETRACTOR STAY HOOK 5MM (MISCELLANEOUS) ×3 IMPLANT
SHEET LAVH (DRAPES) ×3 IMPLANT
SUT CAPIO ETHIBPND (SUTURE) ×10 IMPLANT
SUT VIC AB 0 CT1 27 (SUTURE)
SUT VIC AB 0 CT1 27XBRD ANTBC (SUTURE) IMPLANT
SUT VIC AB 2-0 CT1 27 (SUTURE) ×6
SUT VIC AB 2-0 CT1 27XBRD (SUTURE) ×2 IMPLANT
SUT VIC AB 2-0 SH 27 (SUTURE) ×9
SUT VIC AB 2-0 SH 27X BRD (SUTURE) ×3 IMPLANT
SUT VIC AB 3-0 SH 27 (SUTURE) ×6
SUT VIC AB 3-0 SH 27XBRD (SUTURE) ×2 IMPLANT
SUT VICRYL 0 UR6 27IN ABS (SUTURE) ×6 IMPLANT
SYRINGE 10CC LL (SYRINGE) ×3 IMPLANT
TOWEL OR 17X26 10 PK STRL BLUE (TOWEL DISPOSABLE) ×3 IMPLANT
TOWEL OR NON WOVEN STRL DISP B (DISPOSABLE) ×3 IMPLANT
TUBING CONNECTING 10 (TUBING) ×4 IMPLANT
TUBING CONNECTING 10' (TUBING) ×2
TUTOPLAST AXIS 6X12 (Tissue) ×3 IMPLANT
WATER STERILE IRR 1500ML POUR (IV SOLUTION) ×1 IMPLANT
WATER STERILE IRR 500ML POUR (IV SOLUTION) ×3 IMPLANT
YANKAUER SUCT BULB TIP 10FT TU (MISCELLANEOUS) ×3 IMPLANT

## 2015-03-07 NOTE — Progress Notes (Signed)
Looks good No pain No nerve pain Elastic tissue/op findings discussed

## 2015-03-07 NOTE — Anesthesia Postprocedure Evaluation (Signed)
Anesthesia Post Note  Patient: Cindy Blankenship  Procedure(s) Performed: Procedure(s) (LRB): CYSTOSCOPE, CYSTOCELE  AND   (N/A) VAULT PROLAPSE WITH GRAFT  (N/A)  Anesthesia type: General  Patient location: PACU  Post pain: Pain level controlled and Adequate analgesia  Post assessment: Post-op Vital signs reviewed, Patient's Cardiovascular Status Stable, Respiratory Function Stable, Patent Airway and Pain level controlled  Last Vitals:  Filed Vitals:   03/07/15 1117  BP: 119/60  Pulse: 67  Temp: 36.5 C  Resp: 16    Post vital signs: Reviewed and stable  Level of consciousness: awake, alert  and oriented  Complications: No apparent anesthesia complications

## 2015-03-07 NOTE — Anesthesia Preprocedure Evaluation (Signed)
Anesthesia Evaluation  Patient identified by MRN, date of birth, ID band Patient awake    Reviewed: Allergy & Precautions, NPO status , Patient's Chart, lab work & pertinent test results  Airway Mallampati: II   Neck ROM: full    Dental   Pulmonary former smoker,  breath sounds clear to auscultation        Cardiovascular hypertension, Rhythm:regular Rate:Normal     Neuro/Psych    GI/Hepatic   Endo/Other  Hypothyroidism   Renal/GU      Musculoskeletal   Abdominal   Peds  Hematology   Anesthesia Other Findings   Reproductive/Obstetrics                             Anesthesia Physical Anesthesia Plan  ASA: II  Anesthesia Plan: General   Post-op Pain Management:    Induction: Intravenous  Airway Management Planned: Oral ETT  Additional Equipment:   Intra-op Plan:   Post-operative Plan: Extubation in OR  Informed Consent: I have reviewed the patients History and Physical, chart, labs and discussed the procedure including the risks, benefits and alternatives for the proposed anesthesia with the patient or authorized representative who has indicated his/her understanding and acceptance.     Plan Discussed with: CRNA, Anesthesiologist and Surgeon  Anesthesia Plan Comments:         Anesthesia Quick Evaluation  

## 2015-03-07 NOTE — Anesthesia Procedure Notes (Signed)
Procedure Name: Intubation Performed by: Izora GalaHUGHES, Tawna Alwin A Pre-anesthesia Checklist: Patient identified, Emergency Drugs available, Suction available, Patient being monitored and Timeout performed Patient Re-evaluated:Patient Re-evaluated prior to inductionOxygen Delivery Method: Circle system utilized Preoxygenation: Pre-oxygenation with 100% oxygen Intubation Type: IV induction Ventilation: Mask ventilation without difficulty Laryngoscope Size: Mac and 4 Grade View: Grade I Tube type: Oral Tube size: 7.0 mm Number of attempts: 1 Airway Equipment and Method: Stylet and Patient positioned with wedge pillow Placement Confirmation: ETT inserted through vocal cords under direct vision,  positive ETCO2 and breath sounds checked- equal and bilateral Secured at: 21 cm Tube secured with: Tape Dental Injury: Teeth and Oropharynx as per pre-operative assessment

## 2015-03-07 NOTE — Interval H&P Note (Signed)
History and Physical Interval Note:  03/07/2015 7:19 AM  Cindy Blankenship  has presented today for surgery, with the diagnosis of CYSTOCELE RECTOCELE VAULT PROLAPSE   The various methods of treatment have been discussed with the patient and family. After consideration of risks, benefits and other options for treatment, the patient has consented to  Procedure(s): CYSTOCELE AND POSSIBLE RECTOCELE AND  (N/A) VAULT PROLAPSE WITH GRAFT  (N/A) as a surgical intervention .  The patient's history has been reviewed, patient examined, no change in status, stable for surgery.  I have reviewed the patient's chart and labs.  Questions were answered to the patient's satisfaction.     [name]

## 2015-03-07 NOTE — Transfer of Care (Signed)
Immediate Anesthesia Transfer of Care Note  Patient: Cindy Blankenship  Procedure(s) Performed: Procedure(s): CYSTOSCOPE, CYSTOCELE  AND   (N/A) VAULT PROLAPSE WITH GRAFT  (N/A)  Patient Location: PACU  Anesthesia Type:General  Level of Consciousness:  sedated, patient cooperative and responds to stimulation  Airway & Oxygen Therapy:Patient Spontanous Breathing and Patient connected to face mask oxgen  Post-op Assessment:  Report given to PACU RN and Post -op Vital signs reviewed and stable  Post vital signs:  Reviewed and stable  Last Vitals:  Filed Vitals:   03/07/15 0527  BP: 137/74  Pulse: 81  Temp: 36.7 C  Resp: 16    Complications: No apparent anesthesia complications

## 2015-03-07 NOTE — Op Note (Signed)
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 Lorin PicketScott Maddi Collar Asst.: Harrie ForemanAmanda Dancy  The patient has the above diagnoses and consented to the above procedure. Extra care was taken with leg positioning to minimize the risk of compartment syndrome and neuropathy and deep vein thrombosis. She is a very petite lady. Her small grade 3 cystocele just reached the introitus with a modest central defect. Visually she did not have a rectocele. Her vaginal cuff dissended approxi-4 cm though with gentle traction it would come down a few centimeters further. Her tissues were very elastic.   I instilled 20 mL of a lidocaine epinephrine mixture. Between 3 Allis clamps I sharply and bluntly mobilized the overlying vaginal wall mucosa from the underlying pubocervical fascia to the white line bilaterally. I was very pleased with how I mobilized at the apex. I must say that i had to do a lot of sharp dissection staying on the white tissue especially in the midline which surprised me.  I did a 2 layer anterior repair of the modest central defect. I thought the central defect would have been larger. I did not imbricate the bladder neck  I took down my retractor and cystoscoped the patient. Cystoscopically she had excellent repair of the cystocele. There was no distortion of the ureters. There was excellent yellow reflux bilaterally  I finger dissected to the ishial spine bilaterally and swept soft tissue medially. I was very happy with this mobilization. There was no bleeding. She was petite so that there was not a lot of room. Using a Capio device I placed a 0 Ethibond 1 full finger breath medial to the spine in a straight line between the spines. I use 3 passes on the patient's right and one on the left. Rectal examination confirmed  excellent position with no rectal injury. I was very happy with the fixation points. She is a petite lady so hopefully she will not have rectal pain afterwards  Using my usual technique I placed a 0 Vicryl with a UR 6 needle into the pelvic sidewall at the level urethrovesical angle. She had very long vaginal wall flaps bilaterally  I trimmed an appropriate amount of anterior vaginal wall mucosa and closed the anterior vaginal wall with running 2-0 Vicryl on a CT1 needle  On inspection she had excellent vaginal wall length and very good support anteriorly. I did a rectal examination. As I noticed at the beginning and throughout the case and now at the end of the case she has a lot of pelvic laxity and elasticity of tissues. She really did not have a bulge posteriorly but instead very mobile tissue. I did not think a posterior repair would help her underlying problem and it was obvious that she did not need one at this stage. She obviously did not have an enterocele.  Leg position was good. Blood loss was less than 100 mL. Vaginal pack with Estrace cream was applied.  Hopefully the surgery for reach her treatment goal

## 2015-03-08 ENCOUNTER — Encounter (HOSPITAL_COMMUNITY): Payer: Self-pay | Admitting: Urology

## 2015-03-08 DIAGNOSIS — N8111 Cystocele, midline: Secondary | ICD-10-CM | POA: Diagnosis not present

## 2015-03-08 LAB — BASIC METABOLIC PANEL
ANION GAP: 8 (ref 5–15)
BUN: 7 mg/dL (ref 6–23)
CALCIUM: 9.5 mg/dL (ref 8.4–10.5)
CO2: 29 mmol/L (ref 19–32)
CREATININE: 0.47 mg/dL — AB (ref 0.50–1.10)
Chloride: 103 mmol/L (ref 96–112)
GFR calc Af Amer: >90 mL/min (ref >90)
GFR calc non Af Amer: >90 mL/min (ref >90)
Glucose, Bld: 138 mg/dL — ABNORMAL HIGH (ref 70–99)
Potassium: 3.7 mmol/L (ref 3.5–5.1)
Sodium: 140 mmol/L (ref 135–145)

## 2015-03-08 LAB — HEMOGLOBIN AND HEMATOCRIT, BLOOD
HCT: 36.2 % (ref 36.0–46.0)
HEMOGLOBIN: 12.2 g/dL (ref 12.0–15.0)

## 2015-03-08 MED ORDER — ZOLPIDEM TARTRATE 5 MG PO TABS
5.0000 mg | ORAL_TABLET | Freq: Every evening | ORAL | Status: DC | PRN
  Filled 2015-03-08: qty 1

## 2015-03-08 NOTE — Progress Notes (Signed)
Vaginal packing has been removed per MD order. Pt tolerated well.

## 2015-03-08 NOTE — Discharge Summary (Signed)
Date of admission: 03/07/2015  Date of discharge: 03/08/2015  Admission diagnosis: Cystocele  Discharge diagnosis: Cystocele  Secondary diagnoses: Vault prolapse  History and Physical: For full details, please see admission history and physical. Briefly, Cindy Blankenship is a 64 y.o. year old patient with the above diagnosis.   Hospital Course: Cystocele and vault prolapse surgery went well with excellent post op course  Laboratory values:  Recent Labs  03/07/15 1614 03/08/15 0533  HGB 12.8 12.2  HCT 38.4 36.2    Recent Labs  03/07/15 0635 03/08/15 0533  CREATININE 0.45* 0.47*    Disposition: Home  Discharge instruction: The patient was instructed to be ambulatory but told to refrain from heavy lifting, strenuous activity, or driving. Detailed  Discharge medications:    Medication List    STOP taking these medications        aspirin 81 MG tablet     CALCIUM 600+D HIGH POTENCY 600-400 MG-UNIT per tablet  Generic drug:  Calcium Carbonate-Vitamin D     ibuprofen 200 MG tablet  Commonly known as:  ADVIL,MOTRIN     multivitamin tablet      TAKE these medications        amLODipine 5 MG tablet  Commonly known as:  NORVASC  take 1 tablet by mouth once daily     cetirizine 10 MG tablet  Commonly known as:  ZYRTEC  Take 10 mg by mouth 2 (two) times daily.     estradiol 0.1 MG/24HR patch  Commonly known as:  VIVELLE-DOT  Place 1 patch (0.1 mg total) onto the skin 2 (two) times a week.     fluticasone 50 MCG/ACT nasal spray  Commonly known as:  FLONASE  instill 2 sprays into each nostril once daily     hydrochlorothiazide 25 MG tablet  Commonly known as:  HYDRODIURIL  take 1 tablet by mouth once daily     HYDROcodone-acetaminophen 5-325 MG per tablet  Commonly known as:  NORCO  Take 1-2 tablets by mouth every 6 (six) hours as needed.     hyoscyamine 0.375 MG 12 hr tablet  Commonly known as:  LEVBID  take 1 tablet by mouth every 12 hours for cramping      levothyroxine 75 MCG tablet  Commonly known as:  SYNTHROID, LEVOTHROID  take 1 tablet by mouth every morning BEFORE BREAKFAST     simvastatin 20 MG tablet  Commonly known as:  ZOCOR  take 1 tablet by mouth at bedtime     VISINE OP  Apply 1 drop to eye every morning.     zolpidem 5 MG tablet  Commonly known as:  AMBIEN  take 1 tablet by mouth at bedtime if needed for sleep        Followup:      Follow-up Information    Follow up with Dinia Joynt A, MD.   Specialty:  Urology   Why:  office will call you with date and time of appointment   Contact information:   [address] [place] [address] [phone]       Follow up with Derwin Reddy A, MD.   Specialty:  Urology   Why:  as scheduled   Contact information:   [address] [place] [address] [phone]

## 2015-03-08 NOTE — Progress Notes (Signed)
Completed D/C teaching with pt. Answered all questions. Pt will be D/C home with family and is in stable condition.

## 2015-03-08 NOTE — Progress Notes (Signed)
Vitals and labs normal No/minimal pain Feels very good Instructions detailed

## 2015-03-08 NOTE — Discharge Instructions (Signed)
As discussed with Dr. Sherron MondayMacdiarmid.  Take a stool softner every day and use a laxative as needed to start bowel movements and keep them moving.   I have reviewed discharge instructions in detail with the patient. They will follow-up with me or their physician as scheduled. My nurse will also be calling the patients as per protocol.  Restart vitamins and ASA saturday

## 2015-03-08 NOTE — Progress Notes (Signed)
UR completed 

## 2015-03-12 ENCOUNTER — Other Ambulatory Visit: Payer: Self-pay | Admitting: Family Medicine

## 2015-03-13 NOTE — Telephone Encounter (Signed)
Last OV 07-14-14 ambien last filled 10-11-14 #30 with 3

## 2015-03-14 ENCOUNTER — Other Ambulatory Visit: Payer: Self-pay | Admitting: Family Medicine

## 2015-03-15 NOTE — Telephone Encounter (Signed)
Med denied, pt is no longer under provider care.

## 2015-05-25 ENCOUNTER — Encounter: Payer: Self-pay | Admitting: Family Medicine

## 2015-05-30 ENCOUNTER — Telehealth: Payer: Self-pay | Admitting: Family Medicine

## 2015-05-30 NOTE — Telephone Encounter (Signed)
Patient dismissed from [facility] by Neena RhymesKatherine Tabori MD , effective May 25, 2015. Dismissal letter sent out by certified / registered mail.  DAJ  Received signed domestic return receipt verifying delivery of certified letter on June 12, 2015. Article number [id number] DAJ

## 2015-05-31 ENCOUNTER — Other Ambulatory Visit: Payer: Self-pay | Admitting: Gynecology

## 2015-05-31 ENCOUNTER — Encounter: Payer: Self-pay | Admitting: Gynecology

## 2015-05-31 MED ORDER — ESTRADIOL 0.1 MG/24HR TD PTTW: 1.0000 | MEDICATED_PATCH | TRANSDERMAL | Status: DC

## 2015-06-01 ENCOUNTER — Other Ambulatory Visit: Payer: Self-pay | Admitting: Family Medicine

## 2015-06-01 NOTE — Telephone Encounter (Signed)
Med denied, pt dismissed from practice.  

## 2015-06-13 ENCOUNTER — Encounter: Payer: Self-pay | Admitting: Family Medicine

## 2015-08-04 IMAGING — US US ABDOMEN COMPLETE
1 series · 13 of 25 positions shown · non-contrast
Comparison: 12/09/2008.

CLINICAL DATA: 63-year-old female with abdominal pain for months.
Irritable bowel syndrome. Initial encounter.

EXAM:
ULTRASOUND ABDOMEN COMPLETE

[Series 1: us abdomen complete · 0.20mm/px · 13 of 88 slices shown]
[im 1/88]
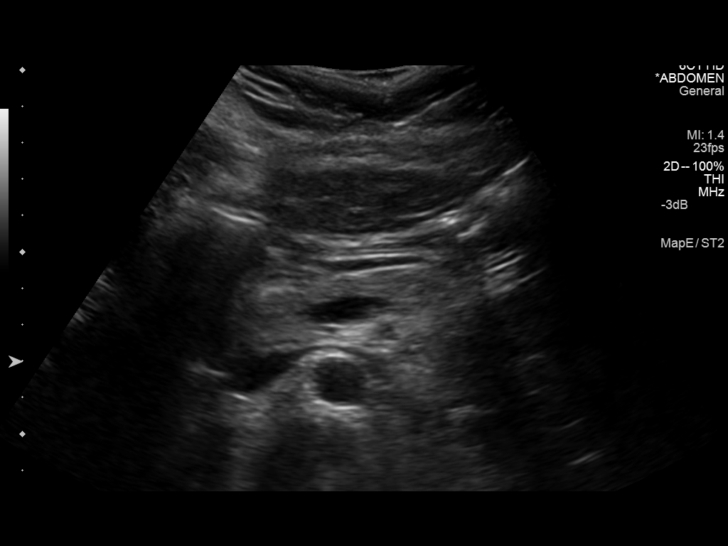
[im 8/88]
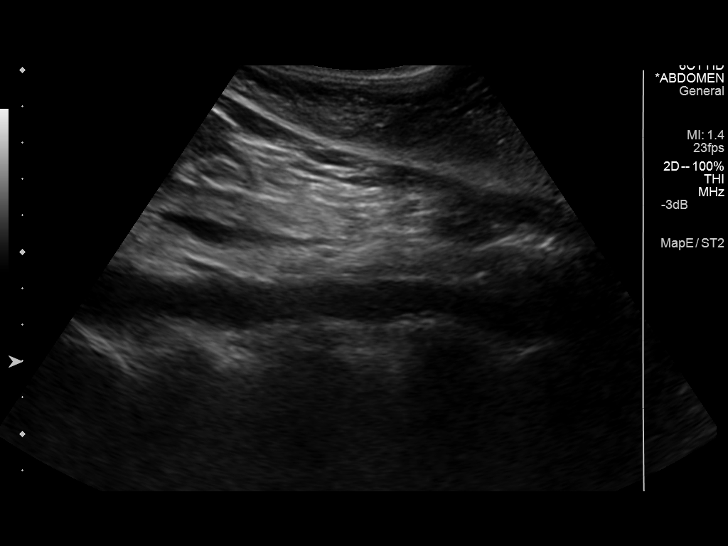
[im 15/88]
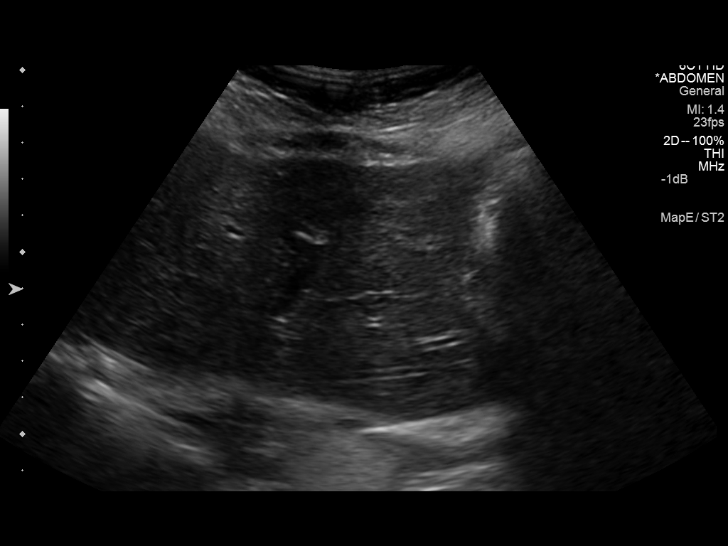
[im 22/88]
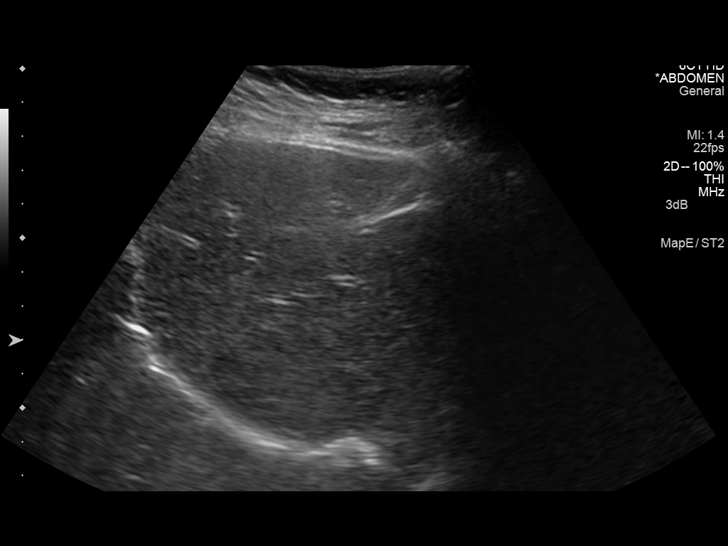
[im 30/88]
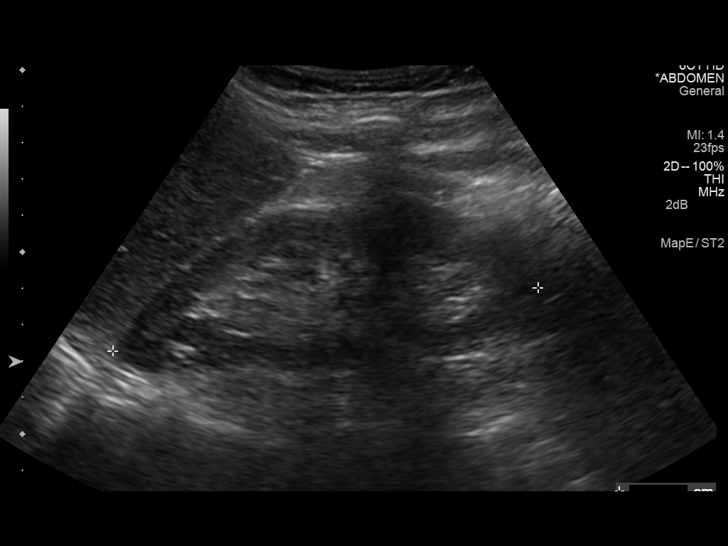
[im 37/88]
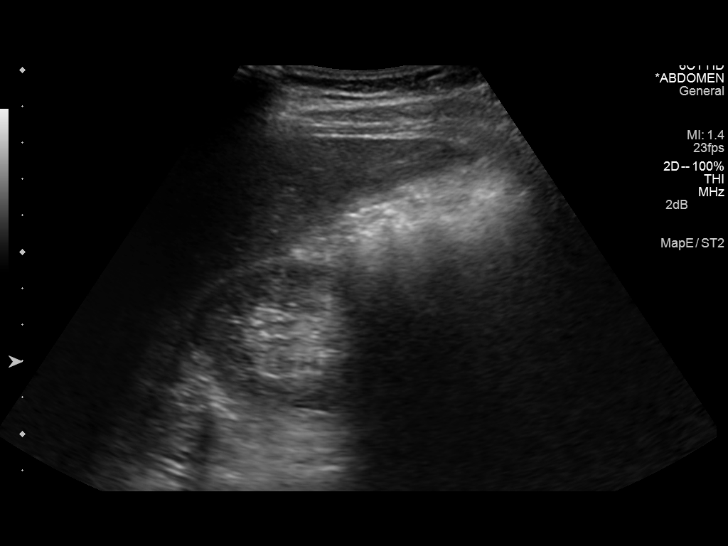
[im 44/88]
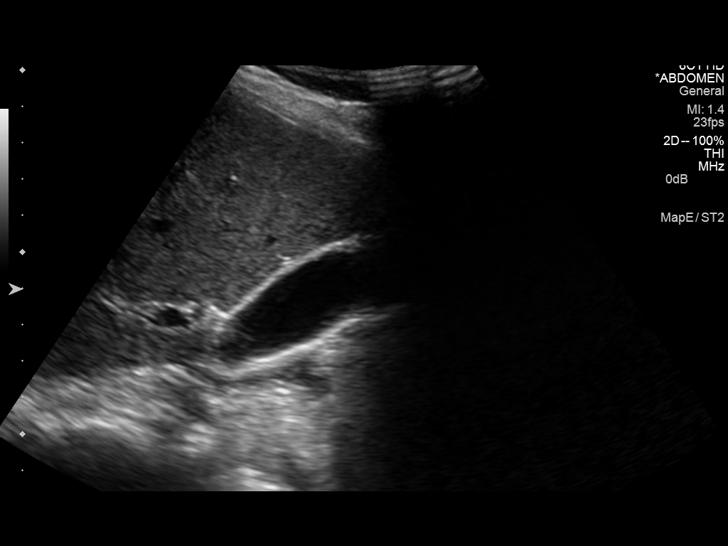
[im 51/88]
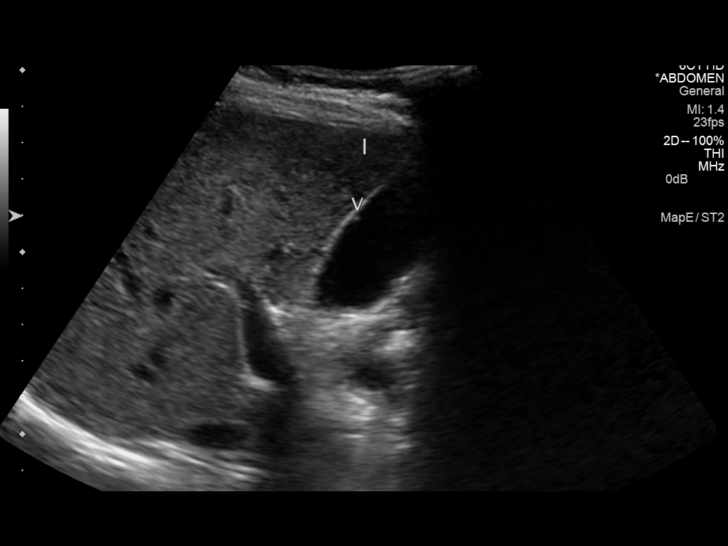
[im 59/88]
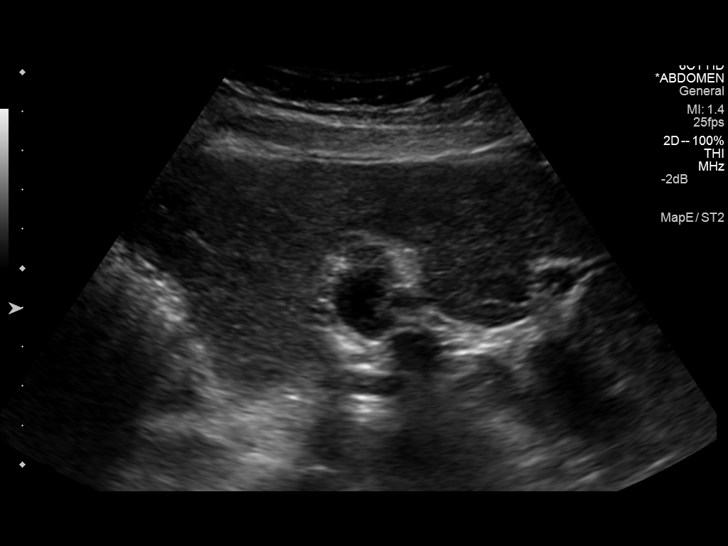
[im 66/88]
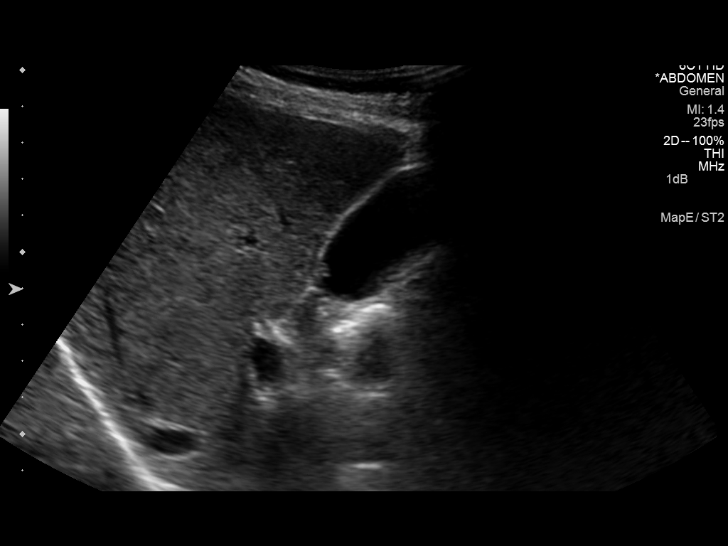
[im 73/88]
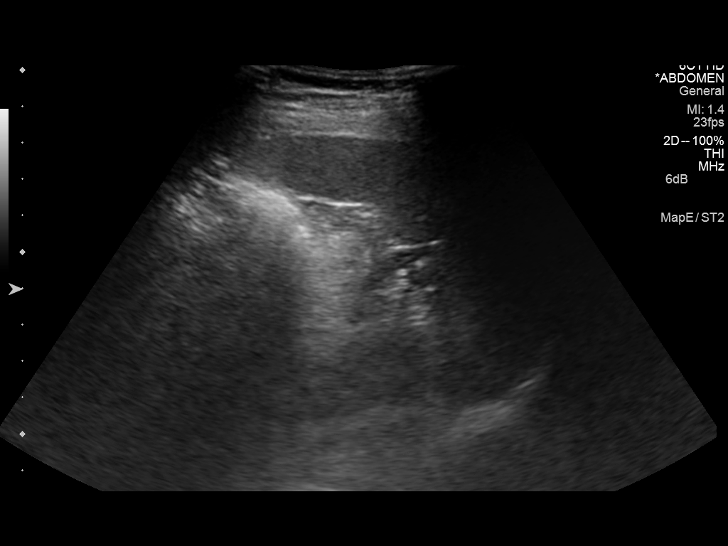
[im 80/88]
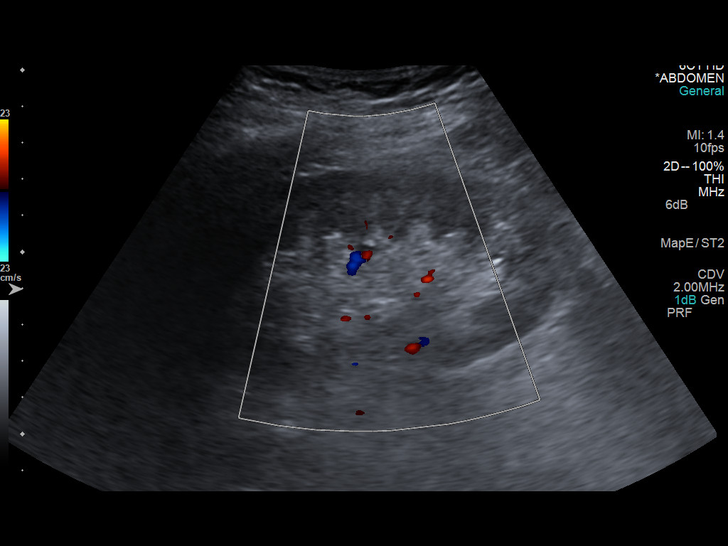
[im 88/88]
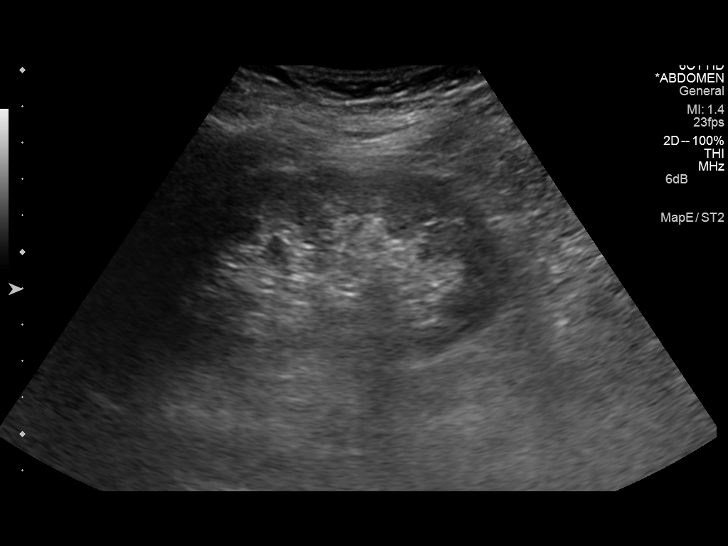

[13 of 25 positions shown; findings below may reference images not displayed]

FINDINGS: Gallbladder: Small polyps largest measuring 4.5 mm. Previously, 4 mm
polyp was noted. No gallbladder wall thickening or pericholecystic
fluid. Per ultrasound technologist patient was not tender over this
region during scanning.

Common bile duct: Diameter: 4.2 mm.

Liver: No focal lesion identified. Within normal limits in
parenchymal echogenicity.

IVC: No abnormality visualized.

Pancreas: Visualized portion unremarkable.

Spleen: Size and appearance within normal limits.

Right Kidney: Length: 11.8 cm.. Inferior pole poorly delineated
secondary to bowel gas. Otherwise no renal mass identified. No
hydronephrosis.

Left Kidney: Length: 11.0 cm. Echogenicity within normal limits. No
mass or hydronephrosis visualized.

Abdominal aorta: No aneurysm visualized.

Other findings: None.
IMPRESSION: Small gallbladder polyps measuring up to 4.5 mm. Previously 4 mm
follow-up was noted. No findings to indicate gallbladder
inflammation.

Inferior aspect of the right kidney and portions of the pancreas
poorly delineated secondary to bowel gas. Majority of the pancreas
and right kidney visualized and with normal appearance.

## 2015-11-12 DIAGNOSIS — M8580 Other specified disorders of bone density and structure, unspecified site: Secondary | ICD-10-CM

## 2015-11-12 DIAGNOSIS — I2510 Atherosclerotic heart disease of native coronary artery without angina pectoris: Secondary | ICD-10-CM

## 2015-11-12 HISTORY — DX: Other specified disorders of bone density and structure, unspecified site: M85.80

## 2015-11-12 HISTORY — DX: Atherosclerotic heart disease of native coronary artery without angina pectoris: I25.10

## 2015-11-23 ENCOUNTER — Encounter: Payer: Self-pay | Admitting: Gynecology

## 2015-11-28 ENCOUNTER — Encounter: Payer: Self-pay | Admitting: Gynecology

## 2015-11-28 ENCOUNTER — Ambulatory Visit (INDEPENDENT_AMBULATORY_CARE_PROVIDER_SITE_OTHER): Payer: Commercial Managed Care - HMO | Admitting: Gynecology

## 2015-11-28 VITALS — BP 120/74 | Ht 60.0 in | Wt 115.0 lb

## 2015-11-28 DIAGNOSIS — N952 Postmenopausal atrophic vaginitis: Secondary | ICD-10-CM

## 2015-11-28 DIAGNOSIS — M8580 Other specified disorders of bone density and structure, unspecified site: Secondary | ICD-10-CM | POA: Diagnosis not present

## 2015-11-28 DIAGNOSIS — Z01419 Encounter for gynecological examination (general) (routine) without abnormal findings: Secondary | ICD-10-CM | POA: Diagnosis not present

## 2015-11-28 DIAGNOSIS — Z79890 Hormone replacement therapy (postmenopausal): Secondary | ICD-10-CM

## 2015-11-28 MED ORDER — ESTRADIOL 0.1 MG/24HR TD PTTW: 1.0000 | MEDICATED_PATCH | TRANSDERMAL | Status: DC

## 2015-11-28 NOTE — Patient Instructions (Addendum)
For bone density as scheduled.  You may obtain a copy of any labs that were done today by logging onto MyChart as outlined in the instructions provided with your AVS (after visit summary). The office will not call with normal lab results but certainly if there are any significant abnormalities then we will contact you.   Health Maintenance Adopting a healthy lifestyle and getting preventive care can go a long way to promote health and wellness. Talk with your health care provider about what schedule of regular examinations is right for you. This is a good chance for you to check in with your provider about disease prevention and staying healthy. In between checkups, there are plenty of things you can do on your own. Experts have done a lot of research about which lifestyle changes and preventive measures are most likely to keep you healthy. Ask your health care provider for more information. WEIGHT AND DIET  Eat a healthy diet  Be sure to include plenty of vegetables, fruits, low-fat dairy products, and lean protein.  Do not eat a lot of foods high in solid fats, added sugars, or salt.  Get regular exercise. This is one of the most important things you can do for your health.  Most adults should exercise for at least 150 minutes each week. The exercise should increase your heart rate and make you sweat (moderate-intensity exercise).  Most adults should also do strengthening exercises at least twice a week. This is in addition to the moderate-intensity exercise.  Maintain a healthy weight  Body mass index (BMI) is a measurement that can be used to identify possible weight problems. It estimates body fat based on height and weight. Your health care provider can help determine your BMI and help you achieve or maintain a healthy weight.  For females 5 years of age and older:   A BMI below 18.5 is considered underweight.  A BMI of 18.5 to 24.9 is normal.  A BMI of 25 to 29.9 is considered  overweight.  A BMI of 30 and above is considered obese.  Watch levels of cholesterol and blood lipids  You should start having your blood tested for lipids and cholesterol at 64 years of age, then have this test every 5 years.  You may need to have your cholesterol levels checked more often if:  Your lipid or cholesterol levels are high.  You are older than 64 years of age.  You are at high risk for heart disease.  CANCER SCREENING   Lung Cancer  Lung cancer screening is recommended for adults 31-31 years old who are at high risk for lung cancer because of a history of smoking.  A yearly low-dose CT scan of the lungs is recommended for people who:  Currently smoke.  Have quit within the past 15 years.  Have at least a 30-pack-year history of smoking. A pack year is smoking an average of one pack of cigarettes a day for 1 year.  Yearly screening should continue until it has been 15 years since you quit.  Yearly screening should stop if you develop a health problem that would prevent you from having lung cancer treatment.  Breast Cancer  Practice breast self-awareness. This means understanding how your breasts normally appear and feel.  It also means doing regular breast self-exams. Let your health care provider know about any changes, no matter how small.  If you are in your 20s or 30s, you should have a clinical breast exam (CBE) by  a health care provider every 1-3 years as part of a regular health exam.  If you are 19 or older, have a CBE every year. Also consider having a breast X-ray (mammogram) every year.  If you have a family history of breast cancer, talk to your health care provider about genetic screening.  If you are at high risk for breast cancer, talk to your health care provider about having an MRI and a mammogram every year.  Breast cancer gene (BRCA) assessment is recommended for women who have family members with BRCA-related cancers. BRCA-related  cancers include:  Breast.  Ovarian.  Tubal.  Peritoneal cancers.  Results of the assessment will determine the need for genetic counseling and BRCA1 and BRCA2 testing. Cervical Cancer Routine pelvic examinations to screen for cervical cancer are no longer recommended for nonpregnant women who are considered low risk for cancer of the pelvic organs (ovaries, uterus, and vagina) and who do not have symptoms. A pelvic examination may be necessary if you have symptoms including those associated with pelvic infections. Ask your health care provider if a screening pelvic exam is right for you.   The Pap test is the screening test for cervical cancer for women who are considered at risk.  If you had a hysterectomy for a problem that was not cancer or a condition that could lead to cancer, then you no longer need Pap tests.  If you are older than 65 years, and you have had normal Pap tests for the past 10 years, you no longer need to have Pap tests.  If you have had past treatment for cervical cancer or a condition that could lead to cancer, you need Pap tests and screening for cancer for at least 20 years after your treatment.  If you no longer get a Pap test, assess your risk factors if they change (such as having a new sexual partner). This can affect whether you should start being screened again.  Some women have medical problems that increase their chance of getting cervical cancer. If this is the case for you, your health care provider may recommend more frequent screening and Pap tests.  The human papillomavirus (HPV) test is another test that may be used for cervical cancer screening. The HPV test looks for the virus that can cause cell changes in the cervix. The cells collected during the Pap test can be tested for HPV.  The HPV test can be used to screen women 43 years of age and older. Getting tested for HPV can extend the interval between normal Pap tests from three to five  years.  An HPV test also should be used to screen women of any age who have unclear Pap test results.  After 64 years of age, women should have HPV testing as often as Pap tests.  Colorectal Cancer  This type of cancer can be detected and often prevented.  Routine colorectal cancer screening usually begins at 64 years of age and continues through 64 years of age.  Your health care provider may recommend screening at an earlier age if you have risk factors for colon cancer.  Your health care provider may also recommend using home test kits to check for hidden blood in the stool.  A small camera at the end of a tube can be used to examine your colon directly (sigmoidoscopy or colonoscopy). This is done to check for the earliest forms of colorectal cancer.  Routine screening usually begins at age 77.  Direct examination  of the colon should be repeated every 5-10 years through 64 years of age. However, you may need to be screened more often if early forms of precancerous polyps or small growths are found. Skin Cancer  Check your skin from head to toe regularly.  Tell your health care provider about any new moles or changes in moles, especially if there is a change in a mole's shape or color.  Also tell your health care provider if you have a mole that is larger than the size of a pencil eraser.  Always use sunscreen. Apply sunscreen liberally and repeatedly throughout the day.  Protect yourself by wearing long sleeves, pants, a wide-brimmed hat, and sunglasses whenever you are outside. HEART DISEASE, DIABETES, AND HIGH BLOOD PRESSURE   Have your blood pressure checked at least every 1-2 years. High blood pressure causes heart disease and increases the risk of stroke.  If you are between 54 years and 29 years old, ask your health care provider if you should take aspirin to prevent strokes.  Have regular diabetes screenings. This involves taking a blood sample to check your fasting  blood sugar level.  If you are at a normal weight and have a low risk for diabetes, have this test once every three years after 64 years of age.  If you are overweight and have a high risk for diabetes, consider being tested at a younger age or more often. PREVENTING INFECTION  Hepatitis B  If you have a higher risk for hepatitis B, you should be screened for this virus. You are considered at high risk for hepatitis B if:  You were born in a country where hepatitis B is common. Ask your health care provider which countries are considered high risk.  Your parents were born in a high-risk country, and you have not been immunized against hepatitis B (hepatitis B vaccine).  You have HIV or AIDS.  You use needles to inject street drugs.  You live with someone who has hepatitis B.  You have had sex with someone who has hepatitis B.  You get hemodialysis treatment.  You take certain medicines for conditions, including cancer, organ transplantation, and autoimmune conditions. Hepatitis C  Blood testing is recommended for:  Everyone born from [birth year] through 1964.  Anyone with known risk factors for hepatitis C. Sexually transmitted infections (STIs)  You should be screened for sexually transmitted infections (STIs) including gonorrhea and chlamydia if:  You are sexually active and are younger than 64 years of age.  You are older than 64 years of age and your health care provider tells you that you are at risk for this type of infection.  Your sexual activity has changed since you were last screened and you are at an increased risk for chlamydia or gonorrhea. Ask your health care provider if you are at risk.  If you do not have HIV, but are at risk, it may be recommended that you take a prescription medicine daily to prevent HIV infection. This is called pre-exposure prophylaxis (PrEP). You are considered at risk if:  You are sexually active and do not regularly use condoms or know  the HIV status of your partner(s).  You take drugs by injection.  You are sexually active with a partner who has HIV. Talk with your health care provider about whether you are at high risk of being infected with HIV. If you choose to begin PrEP, you should first be tested for HIV. You should then be tested every  3 months for as long as you are taking PrEP.  PREGNANCY   If you are premenopausal and you may become pregnant, ask your health care provider about preconception counseling.  If you may become pregnant, take 400 to 800 micrograms (mcg) of folic acid every day.  If you want to prevent pregnancy, talk to your health care provider about birth control (contraception). OSTEOPOROSIS AND MENOPAUSE   Osteoporosis is a disease in which the bones lose minerals and strength with aging. This can result in serious bone fractures. Your risk for osteoporosis can be identified using a bone density scan.  If you are 27 years of age or older, or if you are at risk for osteoporosis and fractures, ask your health care provider if you should be screened.  Ask your health care provider whether you should take a calcium or vitamin D supplement to lower your risk for osteoporosis.  Menopause may have certain physical symptoms and risks.  Hormone replacement therapy may reduce some of these symptoms and risks. Talk to your health care provider about whether hormone replacement therapy is right for you.  HOME CARE INSTRUCTIONS   Schedule regular health, dental, and eye exams.  Stay current with your immunizations.   Do not use any tobacco products including cigarettes, chewing tobacco, or electronic cigarettes.  If you are pregnant, do not drink alcohol.  If you are breastfeeding, limit how much and how often you drink alcohol.  Limit alcohol intake to no more than 1 drink per day for nonpregnant women. One drink equals 12 ounces of beer, 5 ounces of wine, or 1 ounces of hard liquor.  Do not  use street drugs.  Do not share needles.  Ask your health care provider for help if you need support or information about quitting drugs.  Tell your health care provider if you often feel depressed.  Tell your health care provider if you have ever been abused or do not feel safe at home. Document Released: 05/13/2011 Document Revised: 03/14/2014 Document Reviewed: 09/29/2013 [facility] Patient Information 2015 [place], [place]. This information is not intended to replace advice given to you by your health care provider. Make sure you discuss any questions you have with your health care provider.

## 2015-11-28 NOTE — Progress Notes (Signed)
Cindy Blankenship June 25, 1951 [id number]        65 y.o.  [id number]  for annual exam.  Several issues noted below.  Past medical history,surgical history, problem list, medications, allergies, family history and social history were all reviewed and documented as reviewed in the EPIC chart.  ROS:  Performed with pertinent positives and negatives included in the history, assessment and plan.   Additional significant findings :  none   Exam: Kennon Portela assistant Filed Vitals:   11/28/15 1424  BP: 120/74  Height: 5' (1.524 m)  Weight: 115 lb (52.164 kg)   General appearance:  Normal affect, orientation and appearance. Skin: Grossly normal HEENT: Without gross lesions.  No cervical or supraclavicular adenopathy. Thyroid normal.  Lungs:  Clear without wheezing, rales or rhonchi Cardiac: RR, without RMG Abdominal:  Soft, nontender, without masses, guarding, rebound, organomegaly or hernia Breasts:  Examined lying and sitting without masses, retractions, discharge or axillary adenopathy. Pelvic:  Ext/BUS/vagina with mild atrophic changes.  Vaginal graft help. Submucosally. Good support anteriorly and posteriorly  Adnexa  Without masses or tenderness    Anus and perineum  Normal   Rectovaginal  Normal sphincter tone without palpated masses or tenderness.    Assessment/Plan:  65 y.o. [id number] female for annual exam.   1. Postmenopausal/atrophic genital changes. On Vivelle 0.1 mg patches. Wants to continue. I again reviewed the whole issue of HRT and risks to include increased risk of stroke heart attack DVT and possible breast cancer. At this point the patient feels comfortable continuing and I refilled her 1 year. 2. Recent cystocele/rectocele cuff prolapse surgery by Dr. Sherron Monday 02/2015. She's done well without complaints. 3. History of VAIN 2002. Normal Pap smears since then. Pap smear 2014. No Pap smear done today. Plan repeat Pap smear next year a 3 year interval. 4. Mammography 11/2015.  Continue with annual mammography when due. SBE monthly reviewed. 5. Colonoscopy 2013. Repeat at their recommended interval. 6. Osteopenia. DEXA 2014 T score -1.9 FRAX 8.6%/1.1%. Repeat DEXA now a 2 year interval. Patient will schedule follow up for this. 7. Health maintenance. No routine lab work done as patient does this at her primary physician's office. Follow up 1 year, sooner as needed.   Dara Lords MD, 2:46 PM 11/28/2015

## 2015-12-12 ENCOUNTER — Other Ambulatory Visit: Payer: Self-pay | Admitting: Gynecology

## 2015-12-12 ENCOUNTER — Encounter: Payer: Self-pay | Admitting: Gynecology

## 2015-12-12 ENCOUNTER — Ambulatory Visit (INDEPENDENT_AMBULATORY_CARE_PROVIDER_SITE_OTHER): Payer: Commercial Managed Care - HMO

## 2015-12-12 DIAGNOSIS — Z13820 Encounter for screening for osteoporosis: Secondary | ICD-10-CM

## 2015-12-12 DIAGNOSIS — M899 Disorder of bone, unspecified: Secondary | ICD-10-CM

## 2015-12-12 DIAGNOSIS — M8589 Other specified disorders of bone density and structure, multiple sites: Secondary | ICD-10-CM

## 2015-12-12 DIAGNOSIS — M8580 Other specified disorders of bone density and structure, unspecified site: Secondary | ICD-10-CM

## 2016-02-22 ENCOUNTER — Other Ambulatory Visit: Payer: Self-pay | Admitting: Family Medicine

## 2016-05-02 ENCOUNTER — Other Ambulatory Visit: Payer: Self-pay | Admitting: Family Medicine

## 2016-05-02 NOTE — Telephone Encounter (Signed)
Med denied, pt has been dismissed from practice.  

## 2016-05-23 ENCOUNTER — Other Ambulatory Visit: Payer: Self-pay | Admitting: Gynecology

## 2016-05-23 ENCOUNTER — Encounter: Payer: Self-pay | Admitting: Gynecology

## 2016-05-23 MED ORDER — ESTRADIOL 1 MG PO TABS: 1.0000 mg | ORAL_TABLET | Freq: Every day | ORAL | Status: DC

## 2016-05-23 NOTE — Telephone Encounter (Signed)
okay for estradiol 1 mg daily #30 with refill through next annual exam

## 2016-09-01 ENCOUNTER — Other Ambulatory Visit: Payer: Self-pay | Admitting: Family Medicine

## 2016-09-09 HISTORY — PX: CORONARY ARTERY BYPASS GRAFT: SHX141

## 2016-11-12 ENCOUNTER — Encounter (HOSPITAL_COMMUNITY): Payer: Self-pay

## 2016-11-12 ENCOUNTER — Encounter (HOSPITAL_COMMUNITY)
Admission: RE | Admit: 2016-11-12 | Discharge: 2016-11-12 | Disposition: A | Discharge status: Home / Self Care | Payer: Commercial Managed Care - HMO | Source: Ambulatory Visit | Attending: Cardiology | Admitting: Cardiology

## 2016-11-12 VITALS — BP 122/60 | HR 76 | Ht 60.0 in | Wt 112.0 lb

## 2016-11-12 DIAGNOSIS — Z029 Encounter for administrative examinations, unspecified: Secondary | ICD-10-CM | POA: Diagnosis present

## 2016-11-12 DIAGNOSIS — Z951 Presence of aortocoronary bypass graft: Secondary | ICD-10-CM

## 2016-11-12 HISTORY — DX: Atherosclerotic heart disease of native coronary artery without angina pectoris: I25.10

## 2016-11-12 NOTE — Progress Notes (Signed)
Cardiac Individual Treatment Plan  Patient Details  Name: Cindy Blankenship MRN: [id number] Date of Birth: 1951/07/25 Referring Provider:   Flowsheet Row CARDIAC REHAB PHASE II ORIENTATION from 11/12/2016 in [facility] CARDIAC [facility]  Referring Provider  Dr Recardo Evangelist at [facility] [Dr Recardo Evangelist  at [facility]]      Initial Encounter Date:  Flowsheet Row CARDIAC REHAB PHASE II ORIENTATION from 11/12/2016 in [facility] CARDIAC REHAB  Date  11/12/16  Referring Provider  Dr Recardo Evangelist at [facility] [Dr Recardo Evangelist  at [facility]]      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  BLACK COHOSH PO, Take 2 tablets by mouth daily., Disp: , Rfl:  .  cetirizine (ZYRTEC) 10 MG tablet, Take 10 mg by mouth daily. , Disp: , Rfl:  .  clobetasol (TEMOVATE) 0.05 % external solution, Apply 1 application topically 2 (two) times daily as needed., Disp: , Rfl:  .  famotidine (PEPCID) 20 MG tablet, Take 20 mg by mouth 2 (two) times daily., Disp: , Rfl:  .  hyoscyamine (LEVBID) 0.375 MG 12 hr tablet, take 1 tablet by mouth every 12 hours if needed for cramping, Disp: 60 tablet, Rfl: 0 .  levothyroxine (SYNTHROID, LEVOTHROID) 75 MCG tablet, take 1 tablet by mouth every morning BEFORE BREAKFAST, Disp: 30 tablet, Rfl: 6 .  metoprolol succinate (TOPROL-XL) 50 MG 24 hr tablet, Take 25 mg by mouth daily. Take with or immediately following a meal., Disp: , Rfl:  .  simvastatin (ZOCOR) 20 MG tablet, take 1 tablet by mouth at bedtime, Disp: 30 tablet, Rfl: 3 .  Tetrahydrozoline HCl (VISINE OP), Apply 1 drop to eye every morning., Disp: , Rfl:  .  zolpidem (AMBIEN) 5 MG tablet, take 1 tablet by mouth at bedtime if needed for sleep, Disp: 30 tablet, Rfl: 3 .  fluticasone (FLONASE) 50 MCG/ACT nasal spray, instill 2 sprays into each nostril once daily (Patient not taking: Reported on  11/12/2016), Disp: 16 g, Rfl: 6 .  HYDROcodone-acetaminophen (NORCO) 5-325 MG per tablet, Take 1-2 tablets by mouth every 6 (six) hours as needed. (Patient not taking: Reported on 11/12/2016), Disp: 30 tablet, Rfl: 0  Past Medical History: Past Medical History:  Diagnosis Date  . Allergic rhinitis   . Back pain   . Coronary artery disease   . Hemorrhoids with complication   . Hyperlipidemia   . Hypertension   . Hypothyroidism   . IBS (irritable bowel syndrome)   . Osteopenia 11/2015   T score -1.9 FRAX 9.2%/1.3% stable from prior DEXA  . Pneumonia   . VAIN (vaginal intraepithelial neoplasia) 2002    Tobacco Use: History  Smoking Status  . Former Smoker  . Quit date: 02/26/1986  Smokeless Tobacco  . Never Used    Comment: 25-30 yrs ago smoked    Labs: Recent Review Flowsheet Data    Labs for ITP Cardiac and Pulmonary Rehab Latest Ref Rng & Units 01/09/2012 07/07/2012 02/10/2013 04/28/2014 07/14/2014   Cholestrol 0 - 200 mg/dL 308 657(Q) 469(G) 295(M) 187   LDLCALC 0 - 99 mg/dL 83 - - 841(L) 98   LDLDIRECT mg/dL - 244.0 102.7 - -   HDL >39.00 mg/dL 25.36 64.40 34.74 25.95 66.00   Trlycerides 0.0 - 149.0 mg/dL 63.8 75.6 433.2 951.8 841.6      Capillary Blood Glucose: No results found for: GLUCAP   Exercise Target  Goals: Date: 11/12/16  Exercise Program Goal: Individual exercise prescription set with THRR, safety & activity barriers. Participant demonstrates ability to understand and report RPE using BORG scale, to self-measure pulse accurately, and to acknowledge the importance of the exercise prescription.  Exercise Prescription Goal: Starting with aerobic activity 30 plus minutes a day, 3 days per week for initial exercise prescription. Provide home exercise prescription and guidelines that participant acknowledges understanding prior to discharge.  Activity Barriers & Risk Stratification:     Activity Barriers & Cardiac Risk Stratification - 11/12/16 1356      Activity  Barriers & Cardiac Risk Stratification   Activity Barriers None   Cardiac Risk Stratification Moderate      6 Minute Walk:     6 Minute Walk    Row Name 11/12/16 1550         6 Minute Walk   Phase Initial     Distance 1695 feet     Walk Time 6 minutes     # of Rest Breaks 0     MPH 3.2     METS 4.13     RPE 11     VO2 Peak 14.48     Symptoms No     Resting HR 76 bpm     Resting BP 122/60     Max Ex. HR 108 bpm     Max Ex. BP 134/74     2 Minute Post BP 114/70        Initial Exercise Prescription:     Initial Exercise Prescription - 11/12/16 1500      Date of Initial Exercise RX and Referring Provider   Date 11/12/16   Referring Provider Dr Recardo Evangelist at [facility]  Dr Recardo Evangelist  at [facility]   Level 0.5   Minutes 10   METs 2.89     NuStep   Level 3   Minutes 10   METs 2     Track   Laps 10   Minutes 10   METs 2.74     Prescription Details   Frequency (times per week) 3   Duration Progress to 30 minutes of continuous aerobic without signs/symptoms of physical distress     Intensity   THRR 40-80% of Max Heartrate 62-125   Ratings of Perceived Exertion 11-13     Progression   Progression Continue progressive overload as per policy without signs/symptoms or physical distress.     Resistance Training   Training Prescription Yes   Weight 2lbs   Reps 10-12      Perform Capillary Blood Glucose checks as needed.  Exercise Prescription Changes:   Exercise Comments:   Discharge Exercise Prescription (Final Exercise Prescription Changes):   Nutrition:  Target Goals: Understanding of nutrition guidelines, daily intake of sodium 1500mg , cholesterol 200mg , calories 30% from fat and 7% or less from saturated fats, daily to have 5 or more servings of fruits and vegetables.  Biometrics:     Pre Biometrics - 11/12/16 1551      Pre Biometrics   Height 5' (1.524 m)   Weight 111 lb 15.9 oz (50.8 kg)   Waist  Circumference 28 inches   Hip Circumference 37 inches   Waist to Hip Ratio 0.76 %   BMI (Calculated) 21.9   Triceps Skinfold 24 mm   % Body Fat 32.9 %   Grip Strength 26 kg   Flexibility 16 in   Single Leg Stand 30 seconds  Nutrition Therapy Plan and Nutrition Goals:   Nutrition Discharge: Nutrition Scores:   Nutrition Goals Re-Evaluation:   Psychosocial: Target Goals: Acknowledge presence or absence of depression, maximize coping skills, provide positive support system. Participant is able to verbalize types and ability to use techniques and skills needed for reducing stress and depression.  Initial Review & Psychosocial Screening:     Initial Psych Review & Screening - 11/14/16 0906      Family Dynamics   Comments brief psychosocial assesment reveals no further intervention are needed  at this time      Quality of Life Scores:     Quality of Life - 11/12/16 1353      Quality of Life Scores   Health/Function Pre 23.39 %   Socioeconomic Pre 28.58 %   Psych/Spiritual Pre 25.71 %   Family Pre 30 %   GLOBAL Pre 25.77 %      PHQ-9: Recent Review Flowsheet Data    Depression screen [facility] 2/9 04/28/2014 02/10/2013   Decreased Interest 0 0   Down, Depressed, Hopeless 0 0   PHQ - 2 Score 0 0      Psychosocial Evaluation and Intervention:   Psychosocial Re-Evaluation:   Vocational Rehabilitation: Provide vocational rehab assistance to qualifying candidates.   Vocational Rehab Evaluation & Intervention:     Vocational Rehab - 11/14/16 0859      Initial Vocational Rehab Evaluation & Intervention   Assessment shows need for Vocational Rehabilitation No      Education: Education Goals: Education classes will be provided on a weekly basis, covering required topics. Participant will state understanding/return demonstration of topics presented.  Learning Barriers/Preferences:     Learning Barriers/Preferences - 11/12/16 1356      Learning  Barriers/Preferences   Learning Barriers Sight  wears reading glasses   Learning Preferences Pictoral;Written Material      Education Topics: Count Your Pulse:  -Group instruction provided by verbal instruction, demonstration, patient participation and written materials to support subject.  Instructors address importance of being able to find your pulse and how to count your pulse when at home without a heart monitor.  Patients get hands on experience counting their pulse with staff help and individually.   Heart Attack, Angina, and Risk Factor Modification:  -Group instruction provided by verbal instruction, video, and written materials to support subject.  Instructors address signs and symptoms of angina and heart attacks.    Also discuss risk factors for heart disease and how to make changes to improve heart health risk factors.   Functional Fitness:  -Group instruction provided by verbal instruction, demonstration, patient participation, and written materials to support subject.  Instructors address safety measures for doing things around the house.  Discuss how to get up and down off the floor, how to pick things up properly, how to safely get out of a chair without assistance, and balance training.   Meditation and Mindfulness:  -Group instruction provided by verbal instruction, patient participation, and written materials to support subject.  Instructor addresses importance of mindfulness and meditation practice to help reduce stress and improve awareness.  Instructor also leads participants through a meditation exercise.    Stretching for Flexibility and Mobility:  -Group instruction provided by verbal instruction, patient participation, and written materials to support subject.  Instructors lead participants through series of stretches that are designed to increase flexibility thus improving mobility.  These stretches are additional exercise for major muscle groups that are  typically performed during regular warm up  and cool down.   Hands Only CPR Anytime:  -Group instruction provided by verbal instruction, video, patient participation and written materials to support subject.  Instructors co-teach with AHA video for hands only CPR.  Participants get hands on experience with mannequins.   Nutrition I class: Heart Healthy Eating:  -Group instruction provided by PowerPoint slides, verbal discussion, and written materials to support subject matter. The instructor gives an explanation and review of the Therapeutic Lifestyle Changes diet recommendations, which includes a discussion on lipid goals, dietary fat, sodium, fiber, plant stanol/sterol esters, sugar, and the components of a well-balanced, healthy diet.   Nutrition II class: Lifestyle Skills:  -Group instruction provided by PowerPoint slides, verbal discussion, and written materials to support subject matter. The instructor gives an explanation and review of label reading, grocery shopping for heart health, heart healthy recipe modifications, and ways to make healthier choices when eating out.   Diabetes Question & Answer:  -Group instruction provided by PowerPoint slides, verbal discussion, and written materials to support subject matter. The instructor gives an explanation and review of diabetes co-morbidities, pre- and post-prandial blood glucose goals, pre-exercise blood glucose goals, signs, symptoms, and treatment of hypoglycemia and hyperglycemia, and foot care basics.   Diabetes Blitz:  -Group instruction provided by PowerPoint slides, verbal discussion, and written materials to support subject matter. The instructor gives an explanation and review of the physiology behind type 1 and type 2 diabetes, diabetes medications and rational behind using different medications, pre- and post-prandial blood glucose recommendations and Hemoglobin A1c goals, diabetes diet, and exercise including blood glucose  guidelines for exercising safely.    Portion Distortion:  -Group instruction provided by PowerPoint slides, verbal discussion, written materials, and food models to support subject matter. The instructor gives an explanation of serving size versus portion size, changes in portions sizes over the last 20 years, and what consists of a serving from each food group.   Stress Management:  -Group instruction provided by verbal instruction, video, and written materials to support subject matter.  Instructors review role of stress in heart disease and how to cope with stress positively.     Exercising on Your Own:  -Group instruction provided by verbal instruction, power point, and written materials to support subject.  Instructors discuss benefits of exercise, components of exercise, frequency and intensity of exercise, and end points for exercise.  Also discuss use of nitroglycerin and activating EMS.  Review options of places to exercise outside of rehab.  Review guidelines for sex with heart disease.   Cardiac Drugs I:  -Group instruction provided by verbal instruction and written materials to support subject.  Instructor reviews cardiac drug classes: antiplatelets, anticoagulants, beta blockers, and statins.  Instructor discusses reasons, side effects, and lifestyle considerations for each drug class.   Cardiac Drugs II:  -Group instruction provided by verbal instruction and written materials to support subject.  Instructor reviews cardiac drug classes: angiotensin converting enzyme inhibitors (ACE-I), angiotensin II receptor blockers (ARBs), nitrates, and calcium channel blockers.  Instructor discusses reasons, side effects, and lifestyle considerations for each drug class.   Anatomy and Physiology of the Circulatory System:  -Group instruction provided by verbal instruction, video, and written materials to support subject.  Reviews functional anatomy of heart, how it relates to various  diagnoses, and what role the heart plays in the overall system.   Knowledge Questionnaire Score:     Knowledge Questionnaire Score - 11/12/16 1350      Knowledge Questionnaire Score   Pre  Score 23/24      Core Components/Risk Factors/Patient Goals at Admission:     Personal Goals and Risk Factors at Admission - 11/14/16 0847      Core Components/Risk Factors/Patient Goals on Admission   Improve shortness of breath with ADL's --   Intervention --   Expected Outcomes --      Core Components/Risk Factors/Patient Goals Review:    Core Components/Risk Factors/Patient Goals at Discharge (Final Review):    ITP Comments:     ITP Comments    Row Name 11/12/16 1354           ITP Comments Dr. Driscilla Moats, Medical Director          Comments: Susann attended orientation from 1300 to 1530 to review rules and guidelines for program. Completed 6 minute walk test, Intitial ITP, and exercise prescription.  VSS. Telemetry-Sinus Rhythm with a rare PVC.  Asymptomatic. Dr Recardo Evangelist is Ms. Bloyd's cardiologist at [facility] in [place].[name], RN,BSN 11/14/2016 9:07 AM

## 2016-11-12 NOTE — Progress Notes (Signed)
Cardiac Rehab Medication Review by a Pharmacist  Does the patient  feel that his/her medications are working for him/her?  yes  Has the patient been experiencing any side effects to the medications prescribed?  no  Does the patient measure his/her own blood pressure or blood glucose at home?  no  Does the patient have any problems obtaining medications due to transportation or finances?   no  Understanding of regimen: good Understanding of indications: good Potential of compliance: good   Pharmacist comments: FH is a [age] yoF who [facility]. She has a good understanding of her regimen and allergies. She does not describe any adverse effects from the medications. I counseled her on the potential for hepatotoxicity from black cohosh and the relevant side effects from each of the other medications on her list. She does not currently check her blood pressure. I counseled her to check blood pressure at least three times a week at different times and record the values.    Alfredo BachJoseph Arminger, Cleotis NipperBS, PharmD Clinical Pharmacy Resident [phone]) 11/12/2016 2:45 PM

## 2016-11-14 ENCOUNTER — Encounter (HOSPITAL_COMMUNITY): Payer: Self-pay

## 2016-11-18 ENCOUNTER — Encounter (HOSPITAL_COMMUNITY)
Admission: RE | Admit: 2016-11-18 | Discharge: 2016-11-18 | Disposition: A | Discharge status: Home / Self Care | Payer: Commercial Managed Care - HMO | Source: Ambulatory Visit | Attending: Cardiology | Admitting: Cardiology

## 2016-11-18 DIAGNOSIS — Z951 Presence of aortocoronary bypass graft: Secondary | ICD-10-CM

## 2016-11-18 DIAGNOSIS — Z029 Encounter for administrative examinations, unspecified: Secondary | ICD-10-CM | POA: Diagnosis not present

## 2016-11-18 NOTE — Progress Notes (Signed)
Daily Session Note  Patient Details  Name: Cindy Blankenship MRN: [id number] Date of Birth: 01-14-1951 Referring Provider:   Flowsheet Row CARDIAC REHAB PHASE II ORIENTATION from 11/12/2016 in [place]  Referring Provider  Dr Ulyses Southward at [facility] [Dr Ulyses Southward  at [place]      Encounter Date: 11/18/2016  Check In:     Session Check In - 11/18/16 1500      Check-In   Location [facility]-Cardiac & Pulmonary Rehab   Staff Present Cleda Mccreedy, MS, Exercise Physiologist;Maria Whitaker, RN, BSN;Amber Fair, MS, ACSM RCEP, Exercise Physiologist   Supervising physician immediately available to respond to emergencies [facility] immediately available   Physician(s) Dr. Tana Coast   Medication changes reported     No   Fall or balance concerns reported    No   Warm-up and Cool-down Performed as group-led instruction   Resistance Training Performed Yes   VAD Patient? No     Pain Assessment   Currently in Pain? No/denies   Multiple Pain Sites No      Capillary Blood Glucose: No results found for this or any previous visit (from the past 24 hour(s)).   Goals Met:  Exercise tolerated well  Goals Unmet:  Not Applicable  Comments: [name] started cardiac rehab today.  Pt tolerated light exercise without difficulty. VSS, telemetry-Sinus rhythm, asymptomatic.  Medication list reconciled. Pt denies barriers to medicaiton compliance.  PSYCHOSOCIAL ASSESSMENT:  PHQ-0. Pt exhibits positive coping skills, hopeful outlook with supportive family. No psychosocial needs identified at this time, no psychosocial interventions necessary.    Pt enjoys reading.   Pt oriented to exercise equipment and routine.    Understanding verbalized.Barnet Pall, RN,BSN 11/18/2016 4:33 PM   Dr. Fransico Him is Medical Director for Cardiac Rehab at [facility].

## 2016-11-20 ENCOUNTER — Encounter (HOSPITAL_COMMUNITY)
Admission: RE | Admit: 2016-11-20 | Discharge: 2016-11-20 | Disposition: A | Discharge status: Home / Self Care | Payer: Commercial Managed Care - HMO | Source: Ambulatory Visit | Attending: Cardiology | Admitting: Cardiology

## 2016-11-20 DIAGNOSIS — Z951 Presence of aortocoronary bypass graft: Secondary | ICD-10-CM

## 2016-11-20 DIAGNOSIS — Z029 Encounter for administrative examinations, unspecified: Secondary | ICD-10-CM | POA: Diagnosis not present

## 2016-11-22 ENCOUNTER — Encounter (HOSPITAL_COMMUNITY)
Admission: RE | Admit: 2016-11-22 | Discharge: 2016-11-22 | Disposition: A | Discharge status: Home / Self Care | Payer: Commercial Managed Care - HMO | Source: Ambulatory Visit | Attending: Cardiology | Admitting: Cardiology

## 2016-11-22 DIAGNOSIS — Z029 Encounter for administrative examinations, unspecified: Secondary | ICD-10-CM | POA: Diagnosis not present

## 2016-11-22 DIAGNOSIS — Z951 Presence of aortocoronary bypass graft: Secondary | ICD-10-CM

## 2016-11-25 ENCOUNTER — Encounter (HOSPITAL_COMMUNITY): Payer: Commercial Managed Care - HMO

## 2016-11-27 ENCOUNTER — Encounter (HOSPITAL_COMMUNITY): Payer: Commercial Managed Care - HMO

## 2016-11-28 ENCOUNTER — Encounter: Payer: Commercial Managed Care - HMO | Admitting: Gynecology

## 2016-11-29 ENCOUNTER — Encounter (HOSPITAL_COMMUNITY)
Admission: RE | Admit: 2016-11-29 | Discharge: 2016-11-29 | Disposition: A | Discharge status: Home / Self Care | Payer: Commercial Managed Care - HMO | Source: Ambulatory Visit | Attending: Cardiology | Admitting: Cardiology

## 2016-11-29 DIAGNOSIS — Z951 Presence of aortocoronary bypass graft: Secondary | ICD-10-CM

## 2016-11-29 NOTE — Progress Notes (Signed)
Cindy Blankenship reports that she has been some pain in her right shoulder when she takes a deep breath, Cindy Blankenship said the pain started in her left rib about 3 days ago. The pain moved to Cindy Blankenship's right shoulder yesterday. Cindy Blankenship reports that she only experiences this when she takes a deep breath. Upon assessment lung fields clear upon ascultation. Oxygen saturation 98% on room air. Telemetry rhythm Sinus rate 92. Blood pressure 108/70.  Cindy Blankenship's office called and notified it was advised that I call Cindy Blankenship's primary care physician. I called Cindy Blankenship's office she advised that I call Cindy Blankenship's cardiologist Cindy Blankenship office to notify about Cindy Blankenship's complaint. Cindy Blankenship office called and notified spoke with Cindy Blankenship. Today's ECG were faxed over to Cindy Blankenship office and reviewed by  Cindy FullingSheila Orum NP. No changes noted. Appointment made for Cindy Blankenship to follow up with Cindy Blankenship on Monday morning. No complaints upon exit from cardiac rehab. Cindy Blankenship knows to go to the emergency room if her pain gets worse. Patient states understanding. Gladstone LighterMaria Blankenship States, RN,BSN 11/29/2016 4:58 PM

## 2016-12-02 ENCOUNTER — Encounter (HOSPITAL_COMMUNITY)
Admission: RE | Admit: 2016-12-02 | Discharge: 2016-12-02 | Disposition: A | Discharge status: Home / Self Care | Payer: Commercial Managed Care - HMO | Source: Ambulatory Visit | Attending: Cardiology | Admitting: Cardiology

## 2016-12-02 DIAGNOSIS — Z951 Presence of aortocoronary bypass graft: Secondary | ICD-10-CM

## 2016-12-02 DIAGNOSIS — Z029 Encounter for administrative examinations, unspecified: Secondary | ICD-10-CM | POA: Diagnosis not present

## 2016-12-04 ENCOUNTER — Encounter (HOSPITAL_COMMUNITY)
Admission: RE | Admit: 2016-12-04 | Discharge: 2016-12-04 | Disposition: A | Discharge status: Home / Self Care | Payer: Commercial Managed Care - HMO | Source: Ambulatory Visit | Attending: Cardiology | Admitting: Cardiology

## 2016-12-04 DIAGNOSIS — Z029 Encounter for administrative examinations, unspecified: Secondary | ICD-10-CM | POA: Diagnosis not present

## 2016-12-04 DIAGNOSIS — Z951 Presence of aortocoronary bypass graft: Secondary | ICD-10-CM

## 2016-12-04 NOTE — Progress Notes (Signed)
Reviewed home exercise program with pt.  Discussed mode/frequency of exercise, RPE scale, THRR and weather conditions for exercising outdoors.  Also discussed signs and symptoms and when to call Dr./911.  Pt verbalized understanding.  Cindy Blankenship 12/04/2016 1650

## 2016-12-05 ENCOUNTER — Ambulatory Visit (INDEPENDENT_AMBULATORY_CARE_PROVIDER_SITE_OTHER): Payer: Commercial Managed Care - HMO | Admitting: Gynecology

## 2016-12-05 ENCOUNTER — Encounter: Payer: Self-pay | Admitting: Gynecology

## 2016-12-05 VITALS — BP 118/74 | Ht 60.0 in | Wt 112.0 lb

## 2016-12-05 DIAGNOSIS — N951 Menopausal and female climacteric states: Secondary | ICD-10-CM | POA: Diagnosis not present

## 2016-12-05 DIAGNOSIS — Z01411 Encounter for gynecological examination (general) (routine) with abnormal findings: Secondary | ICD-10-CM

## 2016-12-05 DIAGNOSIS — M8580 Other specified disorders of bone density and structure, unspecified site: Secondary | ICD-10-CM

## 2016-12-05 DIAGNOSIS — Z79890 Hormone replacement therapy (postmenopausal): Secondary | ICD-10-CM | POA: Diagnosis not present

## 2016-12-05 DIAGNOSIS — N952 Postmenopausal atrophic vaginitis: Secondary | ICD-10-CM | POA: Diagnosis not present

## 2016-12-05 DIAGNOSIS — N893 Dysplasia of vagina, unspecified: Secondary | ICD-10-CM

## 2016-12-05 MED ORDER — ESTRADIOL 0.05 MG/24HR TD PTTW: 1.0000 | MEDICATED_PATCH | TRANSDERMAL | 12 refills | Status: DC

## 2016-12-05 NOTE — Addendum Note (Signed)
Addended by: Dayna BarkerGARDNER, KIMBERLY K on: 12/05/2016 10:11 AM   Modules accepted: Orders

## 2016-12-05 NOTE — Progress Notes (Signed)
    Cindy LullFreda Blankenship 07/31/1951 [id number]        66 y.o.  [id number] for annual exam.  Had open heart surgery in October with multiple vessel bypass. Had her HRT discontinued and now having significant hot flushes sweats mood swings. Wanted to discuss restarting her HRT. Recently saw her cardiologist who approves with this by her history.  Past medical history,surgical history, problem list, medications, allergies, family history and social history were all reviewed and documented as reviewed in the EPIC chart.  ROS:  Performed with pertinent positives and negatives included in the history, assessment and plan.   Additional significant findings :  None   Exam: Cindy PortelaKim Blankenship assistant Vitals:   12/05/16 0930  BP: 118/74  Weight: 112 lb (50.8 kg)  Height: 5' (1.524 m)   Body mass index is 21.87 kg/m.  General appearance:  Normal affect, orientation and appearance. Skin: Grossly normal HEENT: Without gross lesions.  No cervical or supraclavicular adenopathy. Thyroid normal.  Lungs:  Clear without wheezing, rales or rhonchi Cardiac: RR, without RMG Abdominal:  Soft, nontender, without masses, guarding, rebound, organomegaly or hernia Breasts:  Examined lying and sitting without masses, retractions, discharge or axillary adenopathy. Pelvic:  Ext, BUS, Vagina with mild atrophic changes  Adnexa without masses or tenderness    Anus and perineum normal   Rectovaginal normal sphincter tone without palpated masses or tenderness.    Assessment/Plan:  66 y.o. 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 Status post cystocele/rectocele/cuff prolapse surgery by Dr. Sherron MondayMacDiarmid.   1. Postmenopausal/atrophic genital changes/menopausal symptoms. Had her ERT discontinued and now is having significant symptoms. She reports discussing this with her cardiologist to approved ERT if needed. I reviewed the risks of ERT again with her to include the 2017 NAMS guidelines. Risks to include  thrombosis such as stroke heart attack DVT as well as the breast cancer issue all reviewed. Possible benefits also discussed as far symptom relief bone health noting her osteopenia and possible cardiovascular. Realistic understanding with her bypass history the thrombosis risk and clotting of the grafts discussed. Benefits of transdermal over oral from of thrombosis standpoint also discussed. After a lengthy discussion the patient clearly understands the risks and feels is a quality of life issue and wants to go ahead and reinitiate. Will start with bivalve 0.05 mg patches see how she does. She'll call me if she has any issues with this. Refill 1 year provided. 2. History of cystocele/rectocele/cuff prolapse surgery. Doing well with good support. 3. History of VAIN 2002. Normal Pap smears since then. Pap smear 2014. Pap smear of vaginal cuff done today. 4. Osteopenia. DEXA 2017 T score -1.9 FRAX 9%/1.2%. Stable from prior DEXA. We'll plan repeat DEXA in another year or 2. Increase calcium vitamin D. 5. Mammography due now and patient agrees to call and schedule. SBE monthly reviewed. 6. Colonoscopy 2013. Repeat at their recommended interval. 7. Health maintenance. No routine lab work done as patient does this elsewhere. Follow up in one year, sooner as needed.  Additional time in excess of her routine gynecologic exam was spent in direct face to face counseling and coordination of care in regards to her menopausal symptoms and hormone replacement treatment discussion and prescription.    Cindy Blankenship,Natalina Wieting P MD, 9:56 AM 12/05/2016

## 2016-12-05 NOTE — Patient Instructions (Signed)
Call to Schedule your mammogram  Facilities in [place]: 1)  [facility]. [facility], [address] Phone: [phone] 2)  Dr. Bertrand at [facility]  [address] Phone: [phone]     Mammogram A mammogram is an X-ray test to find changes in a woman's breast. You should get a mammogram if:  You are 65 years of age or older  You have risk factors.   Your doctor recommends that you have one.  BEFORE THE TEST  Do not schedule the test the week before your period, especially if your breasts are sore during this time.  On the day of your mammogram:  Wash your breasts and armpits well. After washing, do not put on any deodorant or talcum powder on until after your test.   Eat and drink as you usually do.   Take your medicines as usual.   If you are diabetic and take insulin, make sure you:   Eat before coming for your test.   Take your insulin as usual.   If you cannot keep your appointment, call before the appointment to cancel. Schedule another appointment.  TEST  You will need to undress from the waist up. You will put on a hospital gown.   Your breast will be put on the mammogram machine, and it will press firmly on your breast with a piece of plastic called a compression paddle. This will make your breast flatter so that the machine can X-ray all parts of your breast.   Both breasts will be X-rayed. Each breast will be X-rayed from above and from the side. An X-ray might need to be taken again if the picture is not good enough.   The mammogram will last about 15 to 30 minutes.  AFTER THE TEST Finding out the results of your test Ask when your test results will be ready. Make sure you get your test results.  Document Released: 01/24/2009 Document Revised: 10/17/2011 Document Reviewed: 01/24/2009 Exit[employer] Patient Information 2012 [employer].   

## 2016-12-06 ENCOUNTER — Encounter (HOSPITAL_COMMUNITY)
Admission: RE | Admit: 2016-12-06 | Discharge: 2016-12-06 | Disposition: A | Discharge status: Home / Self Care | Payer: Commercial Managed Care - HMO | Source: Ambulatory Visit | Attending: Cardiology | Admitting: Cardiology

## 2016-12-06 DIAGNOSIS — Z029 Encounter for administrative examinations, unspecified: Secondary | ICD-10-CM | POA: Diagnosis not present

## 2016-12-06 DIAGNOSIS — Z951 Presence of aortocoronary bypass graft: Secondary | ICD-10-CM

## 2016-12-06 LAB — PAP IG W/ RFLX HPV ASCU

## 2016-12-09 ENCOUNTER — Encounter (HOSPITAL_COMMUNITY)
Admission: RE | Admit: 2016-12-09 | Discharge: 2016-12-09 | Disposition: A | Discharge status: Home / Self Care | Payer: Commercial Managed Care - HMO | Source: Ambulatory Visit | Attending: Cardiology | Admitting: Cardiology

## 2016-12-09 DIAGNOSIS — Z951 Presence of aortocoronary bypass graft: Secondary | ICD-10-CM

## 2016-12-09 DIAGNOSIS — Z029 Encounter for administrative examinations, unspecified: Secondary | ICD-10-CM | POA: Diagnosis not present

## 2016-12-11 ENCOUNTER — Encounter (HOSPITAL_COMMUNITY)
Admission: RE | Admit: 2016-12-11 | Discharge: 2016-12-11 | Disposition: A | Discharge status: Home / Self Care | Payer: Commercial Managed Care - HMO | Source: Ambulatory Visit | Attending: Cardiology | Admitting: Cardiology

## 2016-12-11 DIAGNOSIS — Z951 Presence of aortocoronary bypass graft: Secondary | ICD-10-CM

## 2016-12-11 DIAGNOSIS — Z029 Encounter for administrative examinations, unspecified: Secondary | ICD-10-CM | POA: Diagnosis not present

## 2016-12-11 NOTE — Progress Notes (Signed)
Sharyn LullFreda Thew 66 y.o. female Nutrition Note Spoke with pt.  Nutrition Survey reviewed with pt. Pt is following Step 2 of the Therapeutic Lifestyle Changes diet. Per discussion, pt has followed a heart healthy diet for a long time. Pt recently started decreasing sodium in her diet. Pt expressed understanding of the information reviewed. Pt aware of nutrition education classes offered and is unable to attend nutrition classes due to work. No results found for: HGBA1C Wt Readings from Last 3 Encounters:  12/05/16 112 lb (50.8 kg)  11/12/16 111 lb 15.9 oz (50.8 kg)  11/28/15 115 lb (52.2 kg)   Nutrition Diagnosis Food-and nutrition-related knowledge deficit related to lack of exposure to information as related to diagnosis of: ? CVD  Nutrition Intervention ? Benefits of adopting Therapeutic Lifestyle Changes discussed when Medficts reviewed. ? Pt to attend the Portion Distortion class ? Pt given handouts for: ? Nutrition I class ? Nutrition II class  ? Continue client-centered nutrition education by RD, as part of interdisciplinary care.  Goal ? Pt to describe the benefit of including fruits, vegetables, whole grains, and low-fat dairy products in a heart healthy meal plan.  Monitor and Evaluate progress toward nutrition goal with team.  Mickle PlumbEdna Joua Bake, M.Ed, RD, LDN, CDE 12/11/2016 4:31 PM

## 2016-12-13 ENCOUNTER — Telehealth (HOSPITAL_COMMUNITY): Payer: Self-pay | Admitting: *Deleted

## 2016-12-13 ENCOUNTER — Encounter (HOSPITAL_COMMUNITY)
Admission: RE | Admit: 2016-12-13 | Discharge: 2016-12-13 | Disposition: A | Discharge status: Home / Self Care | Payer: 59 | Source: Ambulatory Visit | Attending: Cardiology | Admitting: Cardiology

## 2016-12-13 DIAGNOSIS — Z79899 Other long term (current) drug therapy: Secondary | ICD-10-CM | POA: Insufficient documentation

## 2016-12-13 DIAGNOSIS — Z09 Encounter for follow-up examination after completed treatment for conditions other than malignant neoplasm: Secondary | ICD-10-CM | POA: Insufficient documentation

## 2016-12-13 DIAGNOSIS — I2510 Atherosclerotic heart disease of native coronary artery without angina pectoris: Secondary | ICD-10-CM | POA: Insufficient documentation

## 2016-12-13 DIAGNOSIS — Z951 Presence of aortocoronary bypass graft: Secondary | ICD-10-CM | POA: Insufficient documentation

## 2016-12-13 NOTE — Progress Notes (Signed)
Cardiac Individual Treatment Plan  Patient Details  Name: Cindy Blankenship MRN: [id number] Date of Birth: 11-Oct-1951 Referring Provider:   Flowsheet Row CARDIAC REHAB PHASE II ORIENTATION from 11/12/2016 in [facility] CARDIAC [facility]  Referring Provider  Dr Recardo Evangelist at [facility] [Dr Recardo Evangelist  at [facility]]      Initial Encounter Date:  Flowsheet Row CARDIAC REHAB PHASE II ORIENTATION from 11/12/2016 in [facility] CARDIAC REHAB  Date  11/12/16  Referring Provider  Dr Recardo Evangelist at [facility] [Dr Recardo Evangelist  at [facility]]      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  cetirizine (ZYRTEC) 10 MG tablet, Take 10 mg by mouth daily. , Disp: , Rfl:  .  clobetasol (TEMOVATE) 0.05 % external solution, Apply 1 application topically 2 (two) times daily as needed., Disp: , Rfl:  .  estradiol (VIVELLE-DOT) 0.05 MG/24HR patch, Place 1 patch (0.05 mg total) onto the skin 2 (two) times a week., Disp: 8 patch, Rfl: 12 .  famotidine (PEPCID) 20 MG tablet, Take 20 mg by mouth 2 (two) times daily., Disp: , Rfl:  .  fluticasone (FLONASE) 50 MCG/ACT nasal spray, instill 2 sprays into each nostril once daily, Disp: 16 g, Rfl: 6 .  hyoscyamine (LEVBID) 0.375 MG 12 hr tablet, take 1 tablet by mouth every 12 hours if needed for cramping, Disp: 60 tablet, Rfl: 0 .  levothyroxine (SYNTHROID, LEVOTHROID) 75 MCG tablet, take 1 tablet by mouth every morning BEFORE BREAKFAST, Disp: 30 tablet, Rfl: 6 .  metoprolol succinate (TOPROL-XL) 50 MG 24 hr tablet, Take 25 mg by mouth daily. Take with or immediately following a meal., Disp: , Rfl:  .  simvastatin (ZOCOR) 20 MG tablet, take 1 tablet by mouth at bedtime, Disp: 30 tablet, Rfl: 3 .  Tetrahydrozoline HCl (VISINE OP), Apply 1 drop to eye every morning., Disp: , Rfl:  .  zolpidem (AMBIEN) 5 MG tablet, take 1 tablet by mouth at  bedtime if needed for sleep, Disp: 30 tablet, Rfl: 3  Past Medical History: Past Medical History:  Diagnosis Date  . Allergic rhinitis   . Back pain   . Coronary artery disease   . Hemorrhoids with complication   . Hyperlipidemia   . Hypertension   . Hypothyroidism   . IBS (irritable bowel syndrome)   . Osteopenia 11/2015   T score -1.9 FRAX 9.2%/1.3% stable from prior DEXA  . Pneumonia   . VAIN (vaginal intraepithelial neoplasia) 2002    Tobacco Use: History  Smoking Status  . Former Smoker  . Quit date: 02/26/1986  Smokeless Tobacco  . Never Used    Comment: 25-30 yrs ago smoked    Labs: Recent Review Flowsheet Data    Labs for ITP Cardiac and Pulmonary Rehab Latest Ref Rng & Units 01/09/2012 07/07/2012 02/10/2013 04/28/2014 07/14/2014   Cholestrol 0 - 200 mg/dL 147 829(F) 621(H) 086(V) 187   LDLCALC 0 - 99 mg/dL 83 - - 784(O) 98   LDLDIRECT mg/dL - 962.9 528.4 - -   HDL >39.00 mg/dL 13.24 40.10 27.25 36.64 66.00   Trlycerides 0.0 - 149.0 mg/dL 40.3 47.4 259.5 638.7 564.3      Capillary Blood Glucose: No results found for: GLUCAP   Exercise Target Goals:    Exercise Program Goal: Individual exercise prescription set with THRR, safety & activity barriers. Participant demonstrates ability to understand and report RPE  using BORG scale, to self-measure pulse accurately, and to acknowledge the importance of the exercise prescription.  Exercise Prescription Goal: Starting with aerobic activity 30 plus minutes a day, 3 days per week for initial exercise prescription. Provide home exercise prescription and guidelines that participant acknowledges understanding prior to discharge.  Activity Barriers & Risk Stratification:     Activity Barriers & Cardiac Risk Stratification - 11/12/16 1356      Activity Barriers & Cardiac Risk Stratification   Activity Barriers None   Cardiac Risk Stratification Moderate      6 Minute Walk:     6 Minute Walk    Row Name 11/12/16  1550         6 Minute Walk   Phase Initial     Distance 1695 feet     Walk Time 6 minutes     # of Rest Breaks 0     MPH 3.2     METS 4.13     RPE 11     VO2 Peak 14.48     Symptoms No     Resting HR 76 bpm     Resting BP 122/60     Max Ex. HR 108 bpm     Max Ex. BP 134/74     2 Minute Post BP 114/70        Initial Exercise Prescription:     Initial Exercise Prescription - 11/12/16 1500      Date of Initial Exercise RX and Referring Provider   Date 11/12/16   Referring Provider Dr Recardo Evangelist at [facility]  Dr Recardo Evangelist  at [facility]   Level 0.5   Minutes 10   METs 2.89     NuStep   Level 3   Minutes 10   METs 2     Track   Laps 10   Minutes 10   METs 2.74     Prescription Details   Frequency (times per week) 3   Duration Progress to 30 minutes of continuous aerobic without signs/symptoms of physical distress     Intensity   THRR 40-80% of Max Heartrate 62-125   Ratings of Perceived Exertion 11-13     Progression   Progression Continue progressive overload as per policy without signs/symptoms or physical distress.     Resistance Training   Training Prescription Yes   Weight 2lbs   Reps 10-12      Perform Capillary Blood Glucose checks as needed.  Exercise Prescription Changes:     Exercise Prescription Changes    Row Name 12/09/16 1600             Response to Exercise   Blood Pressure (Admit) 126/80       Blood Pressure (Exercise) 148/82       Blood Pressure (Exit) 126/66       Heart Rate (Admit) 87 bpm       Heart Rate (Exercise) 133 bpm       Heart Rate (Exit) 93 bpm       Rating of Perceived Exertion (Exercise) 13       Duration Progress to 45 minutes of aerobic exercise without signs/symptoms of physical distress       Intensity THRR unchanged         Progression   Progression Continue to progress workloads to maintain intensity without signs/symptoms of physical distress.       Average METs 4.7  Resistance Training   Training Prescription Yes       Weight 3lb       Reps 10-12         Bike   Level 1       Minutes 10       METs 4.7         NuStep   Level 3       Minutes 10       METs 4.7         Track   Laps 22       Minutes 10       METs 4.8         Home Exercise Plan   Plans to continue exercise at Home       Frequency Add 4 additional days to program exercise sessions.          Exercise Comments:     Exercise Comments    Row Name 12/09/16 1635           Exercise Comments Reviewed METs and goals with pt. Pt is doing well with exercise           Discharge Exercise Prescription (Final Exercise Prescription Changes):     Exercise Prescription Changes - 12/09/16 1600      Response to Exercise   Blood Pressure (Admit) 126/80   Blood Pressure (Exercise) 148/82   Blood Pressure (Exit) 126/66   Heart Rate (Admit) 87 bpm   Heart Rate (Exercise) 133 bpm   Heart Rate (Exit) 93 bpm   Rating of Perceived Exertion (Exercise) 13   Duration Progress to 45 minutes of aerobic exercise without signs/symptoms of physical distress   Intensity THRR unchanged     Progression   Progression Continue to progress workloads to maintain intensity without signs/symptoms of physical distress.   Average METs 4.7     Resistance Training   Training Prescription Yes   Weight 3lb   Reps 10-12     Bike   Level 1   Minutes 10   METs 4.7     NuStep   Level 3   Minutes 10   METs 4.7     Track   Laps 22   Minutes 10   METs 4.8     Home Exercise Plan   Plans to continue exercise at Home   Frequency Add 4 additional days to program exercise sessions.      Nutrition:  Target Goals: Understanding of nutrition guidelines, daily intake of sodium 1500mg , cholesterol 200mg , calories 30% from fat and 7% or less from saturated fats, daily to have 5 or more servings of fruits and vegetables.  Biometrics:     Pre Biometrics - 11/12/16 1551      Pre Biometrics    Height 5' (1.524 m)   Weight 111 lb 15.9 oz (50.8 kg)   Waist Circumference 28 inches   Hip Circumference 37 inches   Waist to Hip Ratio 0.76 %   BMI (Calculated) 21.9   Triceps Skinfold 24 mm   % Body Fat 32.9 %   Grip Strength 26 kg   Flexibility 16 in   Single Leg Stand 30 seconds       Nutrition Therapy Plan and Nutrition Goals:     Nutrition Therapy & Goals - 12/11/16 1629      Nutrition Therapy   Diet Therapeutic Lifestyle Changes     Personal Nutrition Goals   Personal Goal #1 Pt to maintain her wt  while in Cardiac Rehab     Intervention Plan   Intervention Prescribe, educate and counsel regarding individualized specific dietary modifications aiming towards targeted core components such as weight, hypertension, lipid management, diabetes, heart failure and other comorbidities.   Expected Outcomes Short Term Goal: Understand basic principles of dietary content, such as calories, fat, sodium, cholesterol and nutrients.;Long Term Goal: Adherence to prescribed nutrition plan.      Nutrition Discharge: Nutrition Scores:     Nutrition Assessments - 12/11/16 1629      MEDFICTS Scores   Pre Score 9      Nutrition Goals Re-Evaluation:   Psychosocial: Target Goals: Acknowledge presence or absence of depression, maximize coping skills, provide positive support system. Participant is able to verbalize types and ability to use techniques and skills needed for reducing stress and depression.  Initial Review & Psychosocial Screening:     Initial Psych Review & Screening - 11/14/16 0906      Family Dynamics   Comments brief psychosocial assesment reveals no further intervention are needed  at this time      Quality of Life Scores:     Quality of Life - 11/12/16 1353      Quality of Life Scores   Health/Function Pre 23.39 %   Socioeconomic Pre 28.58 %   Psych/Spiritual Pre 25.71 %   Family Pre 30 %   GLOBAL Pre 25.77 %      PHQ-9: Recent Review  Flowsheet Data    Depression screen [facility] 2/9 11/18/2016 04/28/2014 02/10/2013   Decreased Interest 0 0 0   Down, Depressed, Hopeless 0 0 0   PHQ - 2 Score 0 0 0      Psychosocial Evaluation and Intervention:   Psychosocial Re-Evaluation:     Psychosocial Re-Evaluation    Row Name 12/13/16 0926             Psychosocial Re-Evaluation   Interventions Encouraged to attend Cardiac Rehabilitation for the exercise       Continued Psychosocial Services Needed No          Vocational Rehabilitation: Provide vocational rehab assistance to qualifying candidates.   Vocational Rehab Evaluation & Intervention:     Vocational Rehab - 11/14/16 0859      Initial Vocational Rehab Evaluation & Intervention   Assessment shows need for Vocational Rehabilitation No      Education: Education Goals: Education classes will be provided on a weekly basis, covering required topics. Participant will state understanding/return demonstration of topics presented.  Learning Barriers/Preferences:     Learning Barriers/Preferences - 11/12/16 1356      Learning Barriers/Preferences   Learning Barriers Sight  wears reading glasses   Learning Preferences Pictoral;Written Material      Education Topics: Count Your Pulse:  -Group instruction provided by verbal instruction, demonstration, patient participation and written materials to support subject.  Instructors address importance of being able to find your pulse and how to count your pulse when at home without a heart monitor.  Patients get hands on experience counting their pulse with staff help and individually.   Heart Attack, Angina, and Risk Factor Modification:  -Group instruction provided by verbal instruction, video, and written materials to support subject.  Instructors address signs and symptoms of angina and heart attacks.    Also discuss risk factors for heart disease and how to make changes to improve heart health risk  factors. Flowsheet Row CARDIAC REHAB PHASE II EXERCISE from 12/11/2016 in [facility] CARDIAC [facility]  Date  12/04/16  Instruction Review Code  2- meets goals/outcomes      Functional Fitness:  -Group instruction provided by verbal instruction, demonstration, patient participation, and written materials to support subject.  Instructors address safety measures for doing things around the house.  Discuss how to get up and down off the floor, how to pick things up properly, how to safely get out of a chair without assistance, and balance training.   Meditation and Mindfulness:  -Group instruction provided by verbal instruction, patient participation, and written materials to support subject.  Instructor addresses importance of mindfulness and meditation practice to help reduce stress and improve awareness.  Instructor also leads participants through a meditation exercise.    Stretching for Flexibility and Mobility:  -Group instruction provided by verbal instruction, patient participation, and written materials to support subject.  Instructors lead participants through series of stretches that are designed to increase flexibility thus improving mobility.  These stretches are additional exercise for major muscle groups that are typically performed during regular warm up and cool down. Flowsheet Row CARDIAC REHAB PHASE II EXERCISE from 12/11/2016 in [facility] CARDIAC REHAB  Date  12/06/16  Instruction Review Code  2- meets goals/outcomes      Hands Only CPR Anytime:  -Group instruction provided by verbal instruction, video, patient participation and written materials to support subject.  Instructors co-teach with AHA video for hands only CPR.  Participants get hands on experience with mannequins.   Nutrition I class: Heart Healthy Eating:  -Group instruction provided by PowerPoint slides, verbal discussion, and written materials to support subject matter. The  instructor gives an explanation and review of the Therapeutic Lifestyle Changes diet recommendations, which includes a discussion on lipid goals, dietary fat, sodium, fiber, plant stanol/sterol esters, sugar, and the components of a well-balanced, healthy diet. Flowsheet Row CARDIAC REHAB PHASE II EXERCISE from 12/11/2016 in [facility] CARDIAC REHAB  Date  12/11/16  Educator  RD  Instruction Review Code  Not applicable [class handouts given]      Nutrition II class: Lifestyle Skills:  -Group instruction provided by PowerPoint slides, verbal discussion, and written materials to support subject matter. The instructor gives an explanation and review of label reading, grocery shopping for heart health, heart healthy recipe modifications, and ways to make healthier choices when eating out. Flowsheet Row CARDIAC REHAB PHASE II EXERCISE from 12/11/2016 in [facility] CARDIAC REHAB  Date  12/11/16  Educator  RD  Instruction Review Code  Not applicable [class handouts given]      Diabetes Question & Answer:  -Group instruction provided by PowerPoint slides, verbal discussion, and written materials to support subject matter. The instructor gives an explanation and review of diabetes co-morbidities, pre- and post-prandial blood glucose goals, pre-exercise blood glucose goals, signs, symptoms, and treatment of hypoglycemia and hyperglycemia, and foot care basics.   Diabetes Blitz:  -Group instruction provided by PowerPoint slides, verbal discussion, and written materials to support subject matter. The instructor gives an explanation and review of the physiology behind type 1 and type 2 diabetes, diabetes medications and rational behind using different medications, pre- and post-prandial blood glucose recommendations and Hemoglobin A1c goals, diabetes diet, and exercise including blood glucose guidelines for exercising safely.    Portion Distortion:  -Group instruction  provided by PowerPoint slides, verbal discussion, written materials, and food models to support subject matter. The instructor gives an explanation of serving size versus portion size, changes in portions sizes over the  last 20 years, and what consists of a serving from each food group.   Stress Management:  -Group instruction provided by verbal instruction, video, and written materials to support subject matter.  Instructors review role of stress in heart disease and how to cope with stress positively.   Flowsheet Row CARDIAC REHAB PHASE II EXERCISE from 12/11/2016 in [facility] CARDIAC REHAB  Date  11/29/16  Instruction Review Code  2- meets goals/outcomes      Exercising on Your Own:  -Group instruction provided by verbal instruction, power point, and written materials to support subject.  Instructors discuss benefits of exercise, components of exercise, frequency and intensity of exercise, and end points for exercise.  Also discuss use of nitroglycerin and activating EMS.  Review options of places to exercise outside of rehab.  Review guidelines for sex with heart disease. Flowsheet Row CARDIAC REHAB PHASE II EXERCISE from 12/11/2016 in [facility] CARDIAC REHAB  Date  12/11/16  Educator  Rosine Door  Instruction Review Code  2- meets goals/outcomes      Cardiac Drugs I:  -Group instruction provided by verbal instruction and written materials to support subject.  Instructor reviews cardiac drug classes: antiplatelets, anticoagulants, beta blockers, and statins.  Instructor discusses reasons, side effects, and lifestyle considerations for each drug class.   Cardiac Drugs II:  -Group instruction provided by verbal instruction and written materials to support subject.  Instructor reviews cardiac drug classes: angiotensin converting enzyme inhibitors (ACE-I), angiotensin II receptor blockers (ARBs), nitrates, and calcium channel blockers.  Instructor  discusses reasons, side effects, and lifestyle considerations for each drug class. Flowsheet Row CARDIAC REHAB PHASE II EXERCISE from 12/11/2016 in [facility] CARDIAC REHAB  Date  11/20/16  Instruction Review Code  2- meets goals/outcomes      Anatomy and Physiology of the Circulatory System:  -Group instruction provided by verbal instruction, video, and written materials to support subject.  Reviews functional anatomy of heart, how it relates to various diagnoses, and what role the heart plays in the overall system.   Knowledge Questionnaire Score:     Knowledge Questionnaire Score - 11/12/16 1350      Knowledge Questionnaire Score   Pre Score 23/24      Core Components/Risk Factors/Patient Goals at Admission:     Personal Goals and Risk Factors at Admission - 11/14/16 0847      Core Components/Risk Factors/Patient Goals on Admission   Improve shortness of breath with ADL's --   Intervention --   Expected Outcomes --      Core Components/Risk Factors/Patient Goals Review:      Goals and Risk Factor Review    Row Name 12/09/16 1635             Core Components/Risk Factors/Patient Goals Review   Personal Goals Review Increase Strength and Stamina       Review Pt states that she's still skeptical about her health, and how hard she is able to push herself.  She states that she just want to get back to her "normal self" where she is able to exercise and do things w/o being preoccupied about her heart health       Expected Outcomes Continue with exercise routine and gradual workload increases in order to build strength and confidence in cardiorespiratory fitness level          Core Components/Risk Factors/Patient Goals at Discharge (Final Review):      Goals and Risk Factor Review -  12/09/16 1635      Core Components/Risk Factors/Patient Goals Review   Personal Goals Review Increase Strength and Stamina   Review Pt states that she's still skeptical  about her health, and how hard she is able to push herself.  She states that she just want to get back to her "normal self" where she is able to exercise and do things w/o being preoccupied about her heart health   Expected Outcomes Continue with exercise routine and gradual workload increases in order to build strength and confidence in cardiorespiratory fitness level      ITP Comments:     ITP Comments    Row Name 11/12/16 1354           ITP Comments Dr. Driscilla Moats, Medical Director          Comments: Cindy Blankenship is making expected progress toward personal goals after completing 9 sessions. Recommend continued exercise and life style modification education including  stress management and relaxation techniques to decrease cardiac risk profile. Cindy Blankenship is enjoying participating in phase 2 cardiac rehab.[name], RN,BSN 12/13/2016 9:29 AM

## 2016-12-16 ENCOUNTER — Encounter (HOSPITAL_COMMUNITY)
Admission: RE | Admit: 2016-12-16 | Discharge: 2016-12-16 | Disposition: A | Discharge status: Home / Self Care | Payer: 59 | Source: Ambulatory Visit | Attending: Cardiology | Admitting: Cardiology

## 2016-12-16 DIAGNOSIS — I2510 Atherosclerotic heart disease of native coronary artery without angina pectoris: Secondary | ICD-10-CM | POA: Diagnosis present

## 2016-12-16 DIAGNOSIS — Z09 Encounter for follow-up examination after completed treatment for conditions other than malignant neoplasm: Secondary | ICD-10-CM | POA: Diagnosis not present

## 2016-12-16 DIAGNOSIS — Z951 Presence of aortocoronary bypass graft: Secondary | ICD-10-CM | POA: Diagnosis not present

## 2016-12-16 DIAGNOSIS — Z79899 Other long term (current) drug therapy: Secondary | ICD-10-CM | POA: Diagnosis not present

## 2016-12-18 ENCOUNTER — Encounter (HOSPITAL_COMMUNITY)
Admission: RE | Admit: 2016-12-18 | Discharge: 2016-12-18 | Disposition: A | Discharge status: Home / Self Care | Payer: 59 | Source: Ambulatory Visit | Attending: Cardiology | Admitting: Cardiology

## 2016-12-18 DIAGNOSIS — Z951 Presence of aortocoronary bypass graft: Secondary | ICD-10-CM

## 2016-12-18 DIAGNOSIS — Z09 Encounter for follow-up examination after completed treatment for conditions other than malignant neoplasm: Secondary | ICD-10-CM | POA: Diagnosis not present

## 2016-12-20 ENCOUNTER — Encounter (HOSPITAL_COMMUNITY)
Admission: RE | Admit: 2016-12-20 | Discharge: 2016-12-20 | Disposition: A | Discharge status: Home / Self Care | Payer: 59 | Source: Ambulatory Visit | Attending: Cardiology | Admitting: Cardiology

## 2016-12-20 DIAGNOSIS — Z09 Encounter for follow-up examination after completed treatment for conditions other than malignant neoplasm: Secondary | ICD-10-CM | POA: Diagnosis not present

## 2016-12-20 DIAGNOSIS — Z951 Presence of aortocoronary bypass graft: Secondary | ICD-10-CM

## 2016-12-23 ENCOUNTER — Encounter (HOSPITAL_COMMUNITY)
Admission: RE | Admit: 2016-12-23 | Discharge: 2016-12-23 | Disposition: A | Discharge status: Home / Self Care | Payer: 59 | Source: Ambulatory Visit | Attending: Cardiology | Admitting: Cardiology

## 2016-12-23 DIAGNOSIS — Z09 Encounter for follow-up examination after completed treatment for conditions other than malignant neoplasm: Secondary | ICD-10-CM | POA: Diagnosis not present

## 2016-12-23 DIAGNOSIS — Z951 Presence of aortocoronary bypass graft: Secondary | ICD-10-CM

## 2016-12-25 ENCOUNTER — Encounter (HOSPITAL_COMMUNITY)
Admission: RE | Admit: 2016-12-25 | Discharge: 2016-12-25 | Disposition: A | Discharge status: Home / Self Care | Payer: 59 | Source: Ambulatory Visit | Attending: Cardiology | Admitting: Cardiology

## 2016-12-25 DIAGNOSIS — Z951 Presence of aortocoronary bypass graft: Secondary | ICD-10-CM

## 2016-12-25 DIAGNOSIS — Z09 Encounter for follow-up examination after completed treatment for conditions other than malignant neoplasm: Secondary | ICD-10-CM | POA: Diagnosis not present

## 2016-12-27 ENCOUNTER — Encounter (HOSPITAL_COMMUNITY)
Admission: RE | Admit: 2016-12-27 | Discharge: 2016-12-27 | Disposition: A | Discharge status: Home / Self Care | Payer: 59 | Source: Ambulatory Visit | Attending: Cardiology | Admitting: Cardiology

## 2016-12-27 DIAGNOSIS — Z951 Presence of aortocoronary bypass graft: Secondary | ICD-10-CM

## 2016-12-27 DIAGNOSIS — Z09 Encounter for follow-up examination after completed treatment for conditions other than malignant neoplasm: Secondary | ICD-10-CM | POA: Diagnosis not present

## 2016-12-30 ENCOUNTER — Telehealth (HOSPITAL_COMMUNITY): Payer: Self-pay

## 2016-12-30 ENCOUNTER — Encounter (HOSPITAL_COMMUNITY): Payer: 59

## 2017-01-01 ENCOUNTER — Encounter (HOSPITAL_COMMUNITY)
Admission: RE | Admit: 2017-01-01 | Discharge: 2017-01-01 | Disposition: A | Discharge status: Home / Self Care | Payer: 59 | Source: Ambulatory Visit | Attending: Cardiology | Admitting: Cardiology

## 2017-01-01 DIAGNOSIS — Z951 Presence of aortocoronary bypass graft: Secondary | ICD-10-CM

## 2017-01-01 DIAGNOSIS — Z09 Encounter for follow-up examination after completed treatment for conditions other than malignant neoplasm: Secondary | ICD-10-CM | POA: Diagnosis not present

## 2017-01-03 ENCOUNTER — Encounter (HOSPITAL_COMMUNITY)
Admission: RE | Admit: 2017-01-03 | Discharge: 2017-01-03 | Disposition: A | Discharge status: Home / Self Care | Payer: 59 | Source: Ambulatory Visit | Attending: Cardiology | Admitting: Cardiology

## 2017-01-03 DIAGNOSIS — Z09 Encounter for follow-up examination after completed treatment for conditions other than malignant neoplasm: Secondary | ICD-10-CM | POA: Diagnosis not present

## 2017-01-03 DIAGNOSIS — Z951 Presence of aortocoronary bypass graft: Secondary | ICD-10-CM

## 2017-01-06 ENCOUNTER — Encounter (HOSPITAL_COMMUNITY): Payer: 59

## 2017-01-08 ENCOUNTER — Encounter (HOSPITAL_COMMUNITY)
Admission: RE | Admit: 2017-01-08 | Discharge: 2017-01-08 | Disposition: A | Discharge status: Home / Self Care | Payer: 59 | Source: Ambulatory Visit | Attending: Cardiology | Admitting: Cardiology

## 2017-01-08 DIAGNOSIS — Z951 Presence of aortocoronary bypass graft: Secondary | ICD-10-CM

## 2017-01-08 DIAGNOSIS — Z09 Encounter for follow-up examination after completed treatment for conditions other than malignant neoplasm: Secondary | ICD-10-CM | POA: Diagnosis not present

## 2017-01-09 NOTE — Progress Notes (Signed)
Cardiac Individual Treatment Plan  Patient Details  Name: Cindy Blankenship MRN: [id number] Date of Birth: 1951-07-28 Referring Provider:   Flowsheet Row CARDIAC REHAB PHASE II ORIENTATION from 11/12/2016 in [facility] CARDIAC [facility]  Referring Provider  Dr Recardo Evangelist at [facility] [Dr Recardo Evangelist  at [facility]]      Initial Encounter Date:  Flowsheet Row CARDIAC REHAB PHASE II ORIENTATION from 11/12/2016 in [facility] CARDIAC REHAB  Date  11/12/16  Referring Provider  Dr Recardo Evangelist at [facility] [Dr Recardo Evangelist  at [facility]]      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  cetirizine (ZYRTEC) 10 MG tablet, Take 10 mg by mouth daily. , Disp: , Rfl:  .  clobetasol (TEMOVATE) 0.05 % external solution, Apply 1 application topically 2 (two) times daily as needed., Disp: , Rfl:  .  estradiol (VIVELLE-DOT) 0.05 MG/24HR patch, Place 1 patch (0.05 mg total) onto the skin 2 (two) times a week., Disp: 8 patch, Rfl: 12 .  famotidine (PEPCID) 20 MG tablet, Take 20 mg by mouth 2 (two) times daily., Disp: , Rfl:  .  fluticasone (FLONASE) 50 MCG/ACT nasal spray, instill 2 sprays into each nostril once daily, Disp: 16 g, Rfl: 6 .  hyoscyamine (LEVBID) 0.375 MG 12 hr tablet, take 1 tablet by mouth every 12 hours if needed for cramping, Disp: 60 tablet, Rfl: 0 .  levothyroxine (SYNTHROID, LEVOTHROID) 75 MCG tablet, take 1 tablet by mouth every morning BEFORE BREAKFAST, Disp: 30 tablet, Rfl: 6 .  metoprolol succinate (TOPROL-XL) 50 MG 24 hr tablet, Take 25 mg by mouth daily. Take with or immediately following a meal., Disp: , Rfl:  .  simvastatin (ZOCOR) 20 MG tablet, take 1 tablet by mouth at bedtime, Disp: 30 tablet, Rfl: 3 .  Tetrahydrozoline HCl (VISINE OP), Apply 1 drop to eye every morning., Disp: , Rfl:  .  zolpidem (AMBIEN) 5 MG tablet, take 1 tablet by mouth at  bedtime if needed for sleep, Disp: 30 tablet, Rfl: 3  Past Medical History: Past Medical History:  Diagnosis Date  . Allergic rhinitis   . Back pain   . Coronary artery disease   . Hemorrhoids with complication   . Hyperlipidemia   . Hypertension   . Hypothyroidism   . IBS (irritable bowel syndrome)   . Osteopenia 11/2015   T score -1.9 FRAX 9.2%/1.3% stable from prior DEXA  . Pneumonia   . VAIN (vaginal intraepithelial neoplasia) 2002    Tobacco Use: History  Smoking Status  . Former Smoker  . Quit date: 02/26/1986  Smokeless Tobacco  . Never Used    Comment: 25-30 yrs ago smoked    Labs: Recent Review Flowsheet Data    Labs for ITP Cardiac and Pulmonary Rehab Latest Ref Rng & Units 01/09/2012 07/07/2012 02/10/2013 04/28/2014 07/14/2014   Cholestrol 0 - 200 mg/dL 409 811(B) 147(W) 295(A) 187   LDLCALC 0 - 99 mg/dL 83 - - 213(Y) 98   LDLDIRECT mg/dL - 865.7 846.9 - -   HDL >39.00 mg/dL 62.95 28.41 32.44 01.02 66.00   Trlycerides 0.0 - 149.0 mg/dL 72.5 36.6 440.3 474.2 595.6      Capillary Blood Glucose: No results found for: GLUCAP   Exercise Target Goals:    Exercise Program Goal: Individual exercise prescription set with THRR, safety & activity barriers. Participant demonstrates ability to understand and report RPE  using BORG scale, to self-measure pulse accurately, and to acknowledge the importance of the exercise prescription.  Exercise Prescription Goal: Starting with aerobic activity 30 plus minutes a day, 3 days per week for initial exercise prescription. Provide home exercise prescription and guidelines that participant acknowledges understanding prior to discharge.  Activity Barriers & Risk Stratification:     Activity Barriers & Cardiac Risk Stratification - 11/12/16 1356      Activity Barriers & Cardiac Risk Stratification   Activity Barriers None   Cardiac Risk Stratification Moderate      6 Minute Walk:     6 Minute Walk    Row Name 11/12/16  1550         6 Minute Walk   Phase Initial     Distance 1695 feet     Walk Time 6 minutes     # of Rest Breaks 0     MPH 3.2     METS 4.13     RPE 11     VO2 Peak 14.48     Symptoms No     Resting HR 76 bpm     Resting BP 122/60     Max Ex. HR 108 bpm     Max Ex. BP 134/74     2 Minute Post BP 114/70        Oxygen Initial Assessment:   Oxygen Re-Evaluation:   Oxygen Discharge (Final Oxygen Re-Evaluation):   Initial Exercise Prescription:     Initial Exercise Prescription - 11/12/16 1500      Date of Initial Exercise RX and Referring Provider   Date 11/12/16   Referring Provider Dr Recardo Evangelist at [facility]  Dr Recardo Evangelist  at [facility]   Level 0.5   Minutes 10   METs 2.89     NuStep   Level 3   Minutes 10   METs 2     Track   Laps 10   Minutes 10   METs 2.74     Prescription Details   Frequency (times per week) 3   Duration Progress to 30 minutes of continuous aerobic without signs/symptoms of physical distress     Intensity   THRR 40-80% of Max Heartrate 62-125   Ratings of Perceived Exertion 11-13     Progression   Progression Continue progressive overload as per policy without signs/symptoms or physical distress.     Resistance Training   Training Prescription Yes   Weight 2lbs   Reps 10-12      Perform Capillary Blood Glucose checks as needed.  Exercise Prescription Changes:     Exercise Prescription Changes    Row Name 12/09/16 1600 01/09/17 1100           Response to Exercise   Blood Pressure (Admit) 126/80 107/62      Blood Pressure (Exercise) 148/82 138/78      Blood Pressure (Exit) 126/66 110/74      Heart Rate (Admit) 87 bpm 83 bpm      Heart Rate (Exercise) 133 bpm 143 bpm      Heart Rate (Exit) 93 bpm 84 bpm      Rating of Perceived Exertion (Exercise) 13 12      Duration Progress to 45 minutes of aerobic exercise without signs/symptoms of physical distress Progress to 45 minutes of aerobic  exercise without signs/symptoms of physical distress      Intensity THRR unchanged THRR unchanged  Progression   Progression Continue to progress workloads to maintain intensity without signs/symptoms of physical distress. Continue to progress workloads to maintain intensity without signs/symptoms of physical distress.      Average METs 4.7 5.3        Resistance Training   Training Prescription Yes Yes      Weight 3lb 3lb      Reps 10-12 10-15        Bike   Level 1 -      Minutes 10 -      METs 4.7 -        NuStep   Level 3 4      Minutes 10 10      METs 4.7 5.4        Rower   Level  - 6      Watts  - 51.6      Minutes  - 10      METs  - 5.9        Track   Laps 22 20      Minutes 10 10      METs 4.8 4.67        Home Exercise Plan   Plans to continue exercise at Home  -      Frequency Add 4 additional days to program exercise sessions. Add 4 additional days to program exercise sessions.         Exercise Comments:     Exercise Comments    Row Name 12/09/16 1635 01/09/17 1127         Exercise Comments Reviewed METs and goals with pt. Pt is doing well with exercise  Reviewed METs and goals with pt. Pt is doing great with exercise and tolerating workload increases         Exercise Goals and Review:     Exercise Goals    Row Name 01/09/17 1124             Exercise Goals   Increase Physical Activity Yes       Intervention Provide advice, education, support and counseling about physical activity/exercise needs.;Develop an individualized exercise prescription for aerobic and resistive training based on initial evaluation findings, risk stratification, comorbidities and participant's personal goals.       Expected Outcomes Achievement of increased cardiorespiratory fitness and enhanced flexibility, muscular endurance and strength shown through measurements of functional capacity and personal statement of participant.       Increase Strength and Stamina Yes        Intervention Provide advice, education, support and counseling about physical activity/exercise needs.;Develop an individualized exercise prescription for aerobic and resistive training based on initial evaluation findings, risk stratification, comorbidities and participant's personal goals.       Expected Outcomes Achievement of increased cardiorespiratory fitness and enhanced flexibility, muscular endurance and strength shown through measurements of functional capacity and personal statement of participant.          Exercise Goals Re-Evaluation :     Exercise Goals Re-Evaluation    Row Name 01/09/17 1124             Exercise Goal Re-Evaluation   Exercise Goals Review Increase Strenth and Stamina;Increase Physical Activity       Comments Pt states that since beginning CR and seeing how she is tolerating workload increase, she feels the most confident (with exericse) than she's felt since her event. She feels stronger, has more energy and she's walking 30-45 min.  Expected Outcomes Continue with CR exercise prescription and HEP in order to contine to increase strength, stamina, confidence and overall cardiorespiratory fitness           Discharge Exercise Prescription (Final Exercise Prescription Changes):     Exercise Prescription Changes - 01/09/17 1100      Response to Exercise   Blood Pressure (Admit) 107/62   Blood Pressure (Exercise) 138/78   Blood Pressure (Exit) 110/74   Heart Rate (Admit) 83 bpm   Heart Rate (Exercise) 143 bpm   Heart Rate (Exit) 84 bpm   Rating of Perceived Exertion (Exercise) 12   Duration Progress to 45 minutes of aerobic exercise without signs/symptoms of physical distress   Intensity THRR unchanged     Progression   Progression Continue to progress workloads to maintain intensity without signs/symptoms of physical distress.   Average METs 5.3     Resistance Training   Training Prescription Yes   Weight 3lb   Reps 10-15      Bike   Level --   Minutes --   METs --     NuStep   Level 4   Minutes 10   METs 5.4     Rower   Level 6   Watts 51.6   Minutes 10   METs 5.9     Track   Laps 20   Minutes 10   METs 4.67     Home Exercise Plan   Frequency Add 4 additional days to program exercise sessions.      Nutrition:  Target Goals: Understanding of nutrition guidelines, daily intake of sodium 1500mg , cholesterol 200mg , calories 30% from fat and 7% or less from saturated fats, daily to have 5 or more servings of fruits and vegetables.  Biometrics:     Pre Biometrics - 11/12/16 1551      Pre Biometrics   Height 5' (1.524 m)   Weight 111 lb 15.9 oz (50.8 kg)   Waist Circumference 28 inches   Hip Circumference 37 inches   Waist to Hip Ratio 0.76 %   BMI (Calculated) 21.9   Triceps Skinfold 24 mm   % Body Fat 32.9 %   Grip Strength 26 kg   Flexibility 16 in   Single Leg Stand 30 seconds       Nutrition Therapy Plan and Nutrition Goals:     Nutrition Therapy & Goals - 12/11/16 1629      Nutrition Therapy   Diet Therapeutic Lifestyle Changes     Personal Nutrition Goals   Nutrition Goal Pt to maintain her wt while in Cardiac Rehab     Intervention Plan   Intervention Prescribe, educate and counsel regarding individualized specific dietary modifications aiming towards targeted core components such as weight, hypertension, lipid management, diabetes, heart failure and other comorbidities.   Expected Outcomes Short Term Goal: Understand basic principles of dietary content, such as calories, fat, sodium, cholesterol and nutrients.;Long Term Goal: Adherence to prescribed nutrition plan.      Nutrition Discharge: Nutrition Scores:     Nutrition Assessments - 12/11/16 1629      MEDFICTS Scores   Pre Score 9      Nutrition Goals Re-Evaluation:   Nutrition Goals Re-Evaluation:   Nutrition Goals Discharge (Final Nutrition Goals Re-Evaluation):   Psychosocial: Target Goals:  Acknowledge presence or absence of significant depression and/or stress, maximize coping skills, provide positive support system. Participant is able to verbalize types and ability to use techniques and skills needed for reducing stress  and depression.  Initial Review & Psychosocial Screening:     Initial Psych Review & Screening - 11/14/16 0906      Family Dynamics   Comments brief psychosocial assesment reveals no further intervention are needed  at this time      Quality of Life Scores:     Quality of Life - 11/12/16 1353      Quality of Life Scores   Health/Function Pre 23.39 %   Socioeconomic Pre 28.58 %   Psych/Spiritual Pre 25.71 %   Family Pre 30 %   GLOBAL Pre 25.77 %      PHQ-9: Recent Review Flowsheet Data    [facility] 11/18/2016 04/28/2014 02/10/2013   Decreased Interest 0 0 0   Down, Depressed, Hopeless 0 0 0   PHQ - 2 Score 0 0 0     Interpretation of Total Score  Total Score Depression Severity:  1-4 = Minimal depression, 5-9 = Mild depression, 10-14 = Moderate depression, 15-19 = Moderately severe depression, 20-27 = Severe depression   Psychosocial Evaluation and Intervention:   Psychosocial Re-Evaluation:     Psychosocial Re-Evaluation    Row Name 12/13/16 0926 01/09/17 1507           Psychosocial Re-Evaluation   Current issues with  - None Identified      Interventions Encouraged to attend Cardiac Rehabilitation for the exercise Encouraged to attend Cardiac Rehabilitation for the exercise      Continue Psychosocial Services  No No Follow up required         Psychosocial Discharge (Final Psychosocial Re-Evaluation):     Psychosocial Re-Evaluation - 01/09/17 1507      Psychosocial Re-Evaluation   Current issues with None Identified   Interventions Encouraged to attend Cardiac Rehabilitation for the exercise   Continue Psychosocial Services  No Follow up required      Vocational Rehabilitation: Provide vocational rehab  assistance to qualifying candidates.   Vocational Rehab Evaluation & Intervention:     Vocational Rehab - 11/14/16 0859      Initial Vocational Rehab Evaluation & Intervention   Assessment shows need for Vocational Rehabilitation No      Education: Education Goals: Education classes will be provided on a weekly basis, covering required topics. Participant will state understanding/return demonstration of topics presented.  Learning Barriers/Preferences:     Learning Barriers/Preferences - 11/12/16 1356      Learning Barriers/Preferences   Learning Barriers Sight  wears reading glasses   Learning Preferences Pictoral;Written Material      Education Topics: Count Your Pulse:  -Group instruction provided by verbal instruction, demonstration, patient participation and written materials to support subject.  Instructors address importance of being able to find your pulse and how to count your pulse when at home without a heart monitor.  Patients get hands on experience counting their pulse with staff help and individually.   Heart Attack, Angina, and Risk Factor Modification:  -Group instruction provided by verbal instruction, video, and written materials to support subject.  Instructors address signs and symptoms of angina and heart attacks.    Also discuss risk factors for heart disease and how to make changes to improve heart health risk factors. Flowsheet Row [facility]  Date  12/04/16  Instruction Review Code  2- meets goals/outcomes      Functional Fitness:  -Group instruction provided by verbal instruction, demonstration, patient participation, and written materials to  support subject.  Instructors address safety measures for doing things around the house.  Discuss how to get up and down off the floor, how to pick things up properly, how to safely get out of a chair without assistance, and  balance training.   Meditation and Mindfulness:  -Group instruction provided by verbal instruction, patient participation, and written materials to support subject.  Instructor addresses importance of mindfulness and meditation practice to help reduce stress and improve awareness.  Instructor also leads participants through a meditation exercise.  Flowsheet Row CARDIAC REHAB PHASE II EXERCISE from 01/08/2017 in [facility] CARDIAC REHAB  Date  01/01/17  Instruction Review Code  2- meets goals/outcomes      Stretching for Flexibility and Mobility:  -Group instruction provided by verbal instruction, patient participation, and written materials to support subject.  Instructors lead participants through series of stretches that are designed to increase flexibility thus improving mobility.  These stretches are additional exercise for major muscle groups that are typically performed during regular warm up and cool down. Flowsheet Row CARDIAC REHAB PHASE II EXERCISE from 01/08/2017 in [facility] CARDIAC REHAB  Date  01/03/17  Instruction Review Code  2- meets goals/outcomes      Hands Only CPR Anytime:  -Group instruction provided by verbal instruction, video, patient participation and written materials to support subject.  Instructors co-teach with [facility] video for hands only CPR.  Participants get hands on experience with mannequins.   Nutrition I class: Heart Healthy Eating:  -Group instruction provided by PowerPoint slides, verbal discussion, and written materials to support subject matter. The instructor gives an explanation and review of the Therapeutic Lifestyle Changes diet recommendations, which includes a discussion on lipid goals, dietary fat, sodium, fiber, plant stanol/sterol esters, sugar, and the components of a well-balanced, healthy diet. Flowsheet Row CARDIAC REHAB PHASE II EXERCISE from 01/08/2017 in [facility] CARDIAC REHAB  Date   12/11/16  Educator  RD  Instruction Review Code  Not applicable [class handouts given]      Nutrition II class: Lifestyle Skills:  -Group instruction provided by PowerPoint slides, verbal discussion, and written materials to support subject matter. The instructor gives an explanation and review of label reading, grocery shopping for heart health, heart healthy recipe modifications, and ways to make healthier choices when eating out. Flowsheet Row CARDIAC REHAB PHASE II EXERCISE from 01/08/2017 in [facility] CARDIAC REHAB  Date  12/11/16  Educator  RD  Instruction Review Code  Not applicable [class handouts given]      Diabetes Question & Answer:  -Group instruction provided by PowerPoint slides, verbal discussion, and written materials to support subject matter. The instructor gives an explanation and review of diabetes co-morbidities, pre- and post-prandial blood glucose goals, pre-exercise blood glucose goals, signs, symptoms, and treatment of hypoglycemia and hyperglycemia, and foot care basics.   Diabetes Blitz:  -Group instruction provided by PowerPoint slides, verbal discussion, and written materials to support subject matter. The instructor gives an explanation and review of the physiology behind type 1 and type 2 diabetes, diabetes medications and rational behind using different medications, pre- and post-prandial blood glucose recommendations and Hemoglobin A1c goals, diabetes diet, and exercise including blood glucose guidelines for exercising safely.    Portion Distortion:  -Group instruction provided by PowerPoint slides, verbal discussion, written materials, and food models to support subject matter. The instructor gives an explanation of serving size versus portion size, changes in portions sizes over the last  20 years, and what consists of a serving from each food group. Flowsheet Row CARDIAC REHAB PHASE II EXERCISE from 01/08/2017 in [facility] CARDIAC REHAB  Date  01/08/17  Educator  RD  Instruction Review Code  2- meets goals/outcomes      Stress Management:  -Group instruction provided by verbal instruction, video, and written materials to support subject matter.  Instructors review role of stress in heart disease and how to cope with stress positively.   Flowsheet Row CARDIAC REHAB PHASE II EXERCISE from 01/08/2017 in [facility] CARDIAC REHAB  Date  11/29/16  Instruction Review Code  2- meets goals/outcomes      Exercising on Your Own:  -Group instruction provided by verbal instruction, power point, and written materials to support subject.  Instructors discuss benefits of exercise, components of exercise, frequency and intensity of exercise, and end points for exercise.  Also discuss use of nitroglycerin and activating EMS.  Review options of places to exercise outside of rehab.  Review guidelines for sex with heart disease. Flowsheet Row CARDIAC REHAB PHASE II EXERCISE from 01/08/2017 in [facility] CARDIAC REHAB  Date  12/11/16  Educator  Rosine Door  Instruction Review Code  2- meets goals/outcomes      Cardiac Drugs I:  -Group instruction provided by verbal instruction and written materials to support subject.  Instructor reviews cardiac drug classes: antiplatelets, anticoagulants, beta blockers, and statins.  Instructor discusses reasons, side effects, and lifestyle considerations for each drug class. Flowsheet Row CARDIAC REHAB PHASE II EXERCISE from 01/08/2017 in [facility] CARDIAC REHAB  Date  12/25/16  Instruction Review Code  2- meets goals/outcomes      Cardiac Drugs II:  -Group instruction provided by verbal instruction and written materials to support subject.  Instructor reviews cardiac drug classes: angiotensin converting enzyme inhibitors (ACE-I), angiotensin II receptor blockers (ARBs), nitrates, and calcium channel blockers.  Instructor  discusses reasons, side effects, and lifestyle considerations for each drug class. Flowsheet Row CARDIAC REHAB PHASE II EXERCISE from 01/08/2017 in [facility] CARDIAC REHAB  Date  11/20/16  Instruction Review Code  2- meets goals/outcomes      Anatomy and Physiology of the Circulatory System:  -Group instruction provided by verbal instruction, video, and written materials to support subject.  Reviews functional anatomy of heart, how it relates to various diagnoses, and what role the heart plays in the overall system. Flowsheet Row CARDIAC REHAB PHASE II EXERCISE from 01/08/2017 in [facility] CARDIAC REHAB  Date  12/18/16  Instruction Review Code  2- meets goals/outcomes      Knowledge Questionnaire Score:     Knowledge Questionnaire Score - 11/12/16 1350      Knowledge Questionnaire Score   Pre Score 23/24      Core Components/Risk Factors/Patient Goals at Admission:     Personal Goals and Risk Factors at Admission - 11/14/16 0847      Core Components/Risk Factors/Patient Goals on Admission   Improve shortness of breath with ADL's --   Intervention --   Expected Outcomes --      Core Components/Risk Factors/Patient Goals Review:      Goals and Risk Factor Review    Row Name 12/09/16 1635             Core Components/Risk Factors/Patient Goals Review   Personal Goals Review Increase Strength and Stamina       Review Pt states that she's still  skeptical about her health, and how hard she is able to push herself.  She states that she just want to get back to her "normal self" where she is able to exercise and do things w/o being preoccupied about her heart health       Expected Outcomes Continue with exercise routine and gradual workload increases in order to build strength and confidence in cardiorespiratory fitness level          Core Components/Risk Factors/Patient Goals at Discharge (Final Review):      Goals and Risk Factor  Review - 12/09/16 1635      Core Components/Risk Factors/Patient Goals Review   Personal Goals Review Increase Strength and Stamina   Review Pt states that she's still skeptical about her health, and how hard she is able to push herself.  She states that she just want to get back to her "normal self" where she is able to exercise and do things w/o being preoccupied about her heart health   Expected Outcomes Continue with exercise routine and gradual workload increases in order to build strength and confidence in cardiorespiratory fitness level      ITP Comments:     ITP Comments    Row Name 11/12/16 1354           ITP Comments Dr. Driscilla Moats, Medical Director          Comments: Cindy Blankenship is making expected progress toward personal goals after completing 18 sessions. Recommend continued exercise and life style modification education including  stress management and relaxation techniques to decrease cardiac risk profile. Cindy Blankenship is enjoying participating in phase 2 cardiac rehab and doing well with exercise. Cindy Blankenship has not had any further complaints of chest discomfort when taking a deep breath.Will continue to monitor the patient throughout  the [email or web address] .[name], RN,BSN 01/09/2017 3:11 PM

## 2017-01-10 ENCOUNTER — Encounter (HOSPITAL_COMMUNITY)
Admission: RE | Admit: 2017-01-10 | Discharge: 2017-01-10 | Disposition: A | Discharge status: Home / Self Care | Payer: Commercial Managed Care - HMO | Source: Ambulatory Visit | Attending: Cardiology | Admitting: Cardiology

## 2017-01-10 DIAGNOSIS — Z09 Encounter for follow-up examination after completed treatment for conditions other than malignant neoplasm: Secondary | ICD-10-CM | POA: Diagnosis not present

## 2017-01-10 DIAGNOSIS — Z951 Presence of aortocoronary bypass graft: Secondary | ICD-10-CM | POA: Diagnosis not present

## 2017-01-10 DIAGNOSIS — Z79899 Other long term (current) drug therapy: Secondary | ICD-10-CM | POA: Insufficient documentation

## 2017-01-10 DIAGNOSIS — I2510 Atherosclerotic heart disease of native coronary artery without angina pectoris: Secondary | ICD-10-CM | POA: Insufficient documentation

## 2017-01-13 ENCOUNTER — Encounter (HOSPITAL_COMMUNITY)
Admission: RE | Admit: 2017-01-13 | Discharge: 2017-01-13 | Disposition: A | Discharge status: Home / Self Care | Payer: Commercial Managed Care - HMO | Source: Ambulatory Visit | Attending: Cardiology | Admitting: Cardiology

## 2017-01-13 DIAGNOSIS — Z951 Presence of aortocoronary bypass graft: Secondary | ICD-10-CM

## 2017-01-13 DIAGNOSIS — Z09 Encounter for follow-up examination after completed treatment for conditions other than malignant neoplasm: Secondary | ICD-10-CM | POA: Diagnosis not present

## 2017-01-15 ENCOUNTER — Encounter (HOSPITAL_COMMUNITY)
Admission: RE | Admit: 2017-01-15 | Discharge: 2017-01-15 | Disposition: A | Discharge status: Home / Self Care | Payer: Commercial Managed Care - HMO | Source: Ambulatory Visit | Attending: Cardiology | Admitting: Cardiology

## 2017-01-15 DIAGNOSIS — Z951 Presence of aortocoronary bypass graft: Secondary | ICD-10-CM

## 2017-01-15 DIAGNOSIS — Z09 Encounter for follow-up examination after completed treatment for conditions other than malignant neoplasm: Secondary | ICD-10-CM | POA: Diagnosis not present

## 2017-01-17 ENCOUNTER — Encounter (HOSPITAL_COMMUNITY)
Admission: RE | Admit: 2017-01-17 | Discharge: 2017-01-17 | Disposition: A | Discharge status: Home / Self Care | Payer: Commercial Managed Care - HMO | Source: Ambulatory Visit | Attending: Cardiology | Admitting: Cardiology

## 2017-01-17 DIAGNOSIS — Z09 Encounter for follow-up examination after completed treatment for conditions other than malignant neoplasm: Secondary | ICD-10-CM | POA: Diagnosis not present

## 2017-01-17 DIAGNOSIS — Z951 Presence of aortocoronary bypass graft: Secondary | ICD-10-CM

## 2017-01-20 ENCOUNTER — Encounter (HOSPITAL_COMMUNITY): Payer: Commercial Managed Care - HMO

## 2017-01-22 ENCOUNTER — Encounter (HOSPITAL_COMMUNITY)
Admission: RE | Admit: 2017-01-22 | Discharge: 2017-01-22 | Disposition: A | Discharge status: Home / Self Care | Payer: Commercial Managed Care - HMO | Source: Ambulatory Visit | Attending: Cardiology | Admitting: Cardiology

## 2017-01-22 DIAGNOSIS — Z951 Presence of aortocoronary bypass graft: Secondary | ICD-10-CM

## 2017-01-22 DIAGNOSIS — Z09 Encounter for follow-up examination after completed treatment for conditions other than malignant neoplasm: Secondary | ICD-10-CM | POA: Diagnosis not present

## 2017-01-24 ENCOUNTER — Encounter (HOSPITAL_COMMUNITY): Admission: RE | Admit: 2017-01-24 | Payer: Commercial Managed Care - HMO | Source: Ambulatory Visit

## 2017-01-24 ENCOUNTER — Telehealth (HOSPITAL_COMMUNITY): Payer: Self-pay | Admitting: *Deleted

## 2017-01-27 ENCOUNTER — Encounter (HOSPITAL_COMMUNITY)
Admission: RE | Admit: 2017-01-27 | Discharge: 2017-01-27 | Disposition: A | Discharge status: Home / Self Care | Payer: Commercial Managed Care - HMO | Source: Ambulatory Visit | Attending: Cardiology | Admitting: Cardiology

## 2017-01-27 DIAGNOSIS — Z951 Presence of aortocoronary bypass graft: Secondary | ICD-10-CM

## 2017-01-27 DIAGNOSIS — Z09 Encounter for follow-up examination after completed treatment for conditions other than malignant neoplasm: Secondary | ICD-10-CM | POA: Diagnosis not present

## 2017-01-29 ENCOUNTER — Encounter (HOSPITAL_COMMUNITY)
Admission: RE | Admit: 2017-01-29 | Discharge: 2017-01-29 | Disposition: A | Discharge status: Home / Self Care | Payer: Commercial Managed Care - HMO | Source: Ambulatory Visit | Attending: Cardiology | Admitting: Cardiology

## 2017-01-29 DIAGNOSIS — Z09 Encounter for follow-up examination after completed treatment for conditions other than malignant neoplasm: Secondary | ICD-10-CM | POA: Diagnosis not present

## 2017-01-29 DIAGNOSIS — Z951 Presence of aortocoronary bypass graft: Secondary | ICD-10-CM

## 2017-01-31 ENCOUNTER — Encounter (HOSPITAL_COMMUNITY)
Admission: RE | Admit: 2017-01-31 | Discharge: 2017-01-31 | Disposition: A | Discharge status: Home / Self Care | Payer: Commercial Managed Care - HMO | Source: Ambulatory Visit | Attending: Cardiology | Admitting: Cardiology

## 2017-01-31 DIAGNOSIS — Z951 Presence of aortocoronary bypass graft: Secondary | ICD-10-CM

## 2017-01-31 DIAGNOSIS — Z09 Encounter for follow-up examination after completed treatment for conditions other than malignant neoplasm: Secondary | ICD-10-CM | POA: Diagnosis not present

## 2017-02-03 ENCOUNTER — Encounter (HOSPITAL_COMMUNITY)
Admission: RE | Admit: 2017-02-03 | Discharge: 2017-02-03 | Disposition: A | Discharge status: Home / Self Care | Payer: Commercial Managed Care - HMO | Source: Ambulatory Visit | Attending: Cardiology | Admitting: Cardiology

## 2017-02-03 DIAGNOSIS — Z09 Encounter for follow-up examination after completed treatment for conditions other than malignant neoplasm: Secondary | ICD-10-CM | POA: Diagnosis not present

## 2017-02-03 DIAGNOSIS — Z951 Presence of aortocoronary bypass graft: Secondary | ICD-10-CM

## 2017-02-05 ENCOUNTER — Encounter (HOSPITAL_COMMUNITY)
Admission: RE | Admit: 2017-02-05 | Discharge: 2017-02-05 | Disposition: A | Discharge status: Home / Self Care | Payer: Commercial Managed Care - HMO | Source: Ambulatory Visit | Attending: Cardiology | Admitting: Cardiology

## 2017-02-05 DIAGNOSIS — Z951 Presence of aortocoronary bypass graft: Secondary | ICD-10-CM

## 2017-02-05 DIAGNOSIS — Z09 Encounter for follow-up examination after completed treatment for conditions other than malignant neoplasm: Secondary | ICD-10-CM | POA: Diagnosis not present

## 2017-02-05 NOTE — Progress Notes (Signed)
Cardiac Individual Treatment Plan  Patient Details  Name: Cindy Blankenship MRN: [id number] Date of Birth: 26-Jan-1951 Referring Provider:     CARDIAC REHAB PHASE II ORIENTATION from 11/12/2016 in [facility] CARDIAC [facility]  Referring Provider  Dr Recardo Evangelist at [facility] [Dr Recardo Evangelist  at [facility]]      Initial Encounter Date:    CARDIAC REHAB PHASE II ORIENTATION from 11/12/2016 in [facility] CARDIAC REHAB  Date  11/12/16  Referring Provider  Dr Recardo Evangelist at [facility] [Dr Recardo Evangelist  at [facility]]      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  cetirizine (ZYRTEC) 10 MG tablet, Take 10 mg by mouth daily. , Disp: , Rfl:  .  clobetasol (TEMOVATE) 0.05 % external solution, Apply 1 application topically 2 (two) times daily as needed., Disp: , Rfl:  .  estradiol (VIVELLE-DOT) 0.05 MG/24HR patch, Place 1 patch (0.05 mg total) onto the skin 2 (two) times a week., Disp: 8 patch, Rfl: 12 .  famotidine (PEPCID) 20 MG tablet, Take 20 mg by mouth 2 (two) times daily., Disp: , Rfl:  .  fluticasone (FLONASE) 50 MCG/ACT nasal spray, instill 2 sprays into each nostril once daily, Disp: 16 g, Rfl: 6 .  hyoscyamine (LEVBID) 0.375 MG 12 hr tablet, take 1 tablet by mouth every 12 hours if needed for cramping, Disp: 60 tablet, Rfl: 0 .  levothyroxine (SYNTHROID, LEVOTHROID) 75 MCG tablet, take 1 tablet by mouth every morning BEFORE BREAKFAST, Disp: 30 tablet, Rfl: 6 .  metoprolol succinate (TOPROL-XL) 50 MG 24 hr tablet, Take 25 mg by mouth daily. Take with or immediately following a meal., Disp: , Rfl:  .  simvastatin (ZOCOR) 20 MG tablet, take 1 tablet by mouth at bedtime, Disp: 30 tablet, Rfl: 3 .  Tetrahydrozoline HCl (VISINE OP), Apply 1 drop to eye every morning., Disp: , Rfl:  .  zolpidem (AMBIEN) 5 MG tablet, take 1 tablet by mouth at bedtime if needed for  sleep, Disp: 30 tablet, Rfl: 3  Past Medical History: Past Medical History:  Diagnosis Date  . Allergic rhinitis   . Back pain   . Coronary artery disease   . Hemorrhoids with complication   . Hyperlipidemia   . Hypertension   . Hypothyroidism   . IBS (irritable bowel syndrome)   . Osteopenia 11/2015   T score -1.9 FRAX 9.2%/1.3% stable from prior DEXA  . Pneumonia   . VAIN (vaginal intraepithelial neoplasia) 2002    Tobacco Use: History  Smoking Status  . Former Smoker  . Quit date: 02/26/1986  Smokeless Tobacco  . Never Used    Comment: 25-30 yrs ago smoked    Labs: Recent Review Flowsheet Data    Labs for ITP Cardiac and Pulmonary Rehab Latest Ref Rng & Units 01/09/2012 07/07/2012 02/10/2013 04/28/2014 07/14/2014   Cholestrol 0 - 200 mg/dL 528 413(K) 440(N) 027(O) 187   LDLCALC 0 - 99 mg/dL 83 - - 536(U) 98   LDLDIRECT mg/dL - 440.3 474.2 - -   HDL >39.00 mg/dL 59.56 38.75 64.33 29.51 66.00   Trlycerides 0.0 - 149.0 mg/dL 88.4 16.6 063.0 160.1 093.2      Capillary Blood Glucose: No results found for: GLUCAP   Exercise Target Goals:    Exercise Program Goal: Individual exercise prescription set with THRR, safety & activity barriers. Participant demonstrates ability to understand and report RPE  using BORG scale, to self-measure pulse accurately, and to acknowledge the importance of the exercise prescription.  Exercise Prescription Goal: Starting with aerobic activity 30 plus minutes a day, 3 days per week for initial exercise prescription. Provide home exercise prescription and guidelines that participant acknowledges understanding prior to discharge.  Activity Barriers & Risk Stratification:     Activity Barriers & Cardiac Risk Stratification - 11/12/16 1356      Activity Barriers & Cardiac Risk Stratification   Activity Barriers None   Cardiac Risk Stratification Moderate      6 Minute Walk:     6 Minute Walk    Row Name 11/12/16 1550         6 Minute  Walk   Phase Initial     Distance 1695 feet     Walk Time 6 minutes     # of Rest Breaks 0     MPH 3.2     METS 4.13     RPE 11     VO2 Peak 14.48     Symptoms No     Resting HR 76 bpm     Resting BP 122/60     Max Ex. HR 108 bpm     Max Ex. BP 134/74     2 Minute Post BP 114/70        Oxygen Initial Assessment:   Oxygen Re-Evaluation:   Oxygen Discharge (Final Oxygen Re-Evaluation):   Initial Exercise Prescription:     Initial Exercise Prescription - 11/12/16 1500      Date of Initial Exercise RX and Referring Provider   Date 11/12/16   Ref[facility]  [facility]     Bike   Level 0.5   Minutes 10   METs 2.89     NuStep   Level 3   Minutes 10   METs 2     Track   Laps 10   Minutes 10   METs 2.74     Prescription Details   Frequency (times per week) 3   Duration Progress to 30 minutes of continuous aerobic without signs/symptoms of physical distress     Intensity   THRR 40-80% of Max Heartrate 62-125   Ratings of Perceived Exertion 11-13     Progression   Progression Continue progressive overload as per policy without signs/symptoms or physical distress.     Resistance Training   Training Prescription Yes   Weight 2lbs   Reps 10-12      Perform Capillary Blood Glucose checks as needed.  Exercise Prescription Changes:     Exercise Prescription Changes    Row Name 12/09/16 1600 01/09/17 1100 01/27/17 1700         Response to Exercise   Blood Pressure (Admit) 126/80 107/62 128/72     Blood Pressure (Exercise) 148/82 138/78 140/70     Blood Pressure (Exit) 126/66 110/74 116/72     Heart Rate (Admit) 87 bpm 83 bpm 76 bpm     Heart Rate (Exercise) 133 bpm 143 bpm 138 bpm     Heart Rate (Exit) 93 bpm 84 bpm 76 bpm     Rating of Perceived Exertion (Exercise) 13 12 12      Duration Progress to 45 minutes of aerobic exercise without signs/symptoms of physical distress Progress  to 45 minutes of aerobic exercise without signs/symptoms of physical distress Progress to 45 minutes of aerobic exercise without signs/symptoms of physical distress  Intensity THRR unchanged THRR unchanged THRR unchanged       Progression   Progression Continue to progress workloads to maintain intensity without signs/symptoms of physical distress. Continue to progress workloads to maintain intensity without signs/symptoms of physical distress. Continue to progress workloads to maintain intensity without signs/symptoms of physical distress.     Average METs 4.7 5.3 5.1       Resistance Training   Training Prescription Yes Yes Yes     Weight 3lb 3lb 4lb     Reps 10-12 10-15 10-15       Bike   Level 1 -  -     Minutes 10 -  -     METs 4.7 -  -       NuStep   Level 3 4 4      Minutes 10 10 10      METs 4.7 5.4 5       Rower   Level  - 6 6     Watts  - 51.6 43     Minutes  - 10 10     METs  - 5.9 6.4       Track   Laps 22 20 16      Minutes 10 10 10      METs 4.8 4.67 3.79       Home Exercise Plan   Plans to continue exercise at Home  -  -     Frequency Add 4 additional days to program exercise sessions. Add 4 additional days to program exercise sessions. Add 4 additional days to program exercise sessions.        Exercise Comments:     Exercise Comments    Row Name 12/09/16 1635 01/09/17 1127 01/30/17 1547       Exercise Comments Reviewed METs and goals with pt. Pt is doing well with exercise  Reviewed METs and goals with pt. Pt is doing great with exercise and tolerating workload increases Reviewed METs and goals with pt. Pt is doing great with exercise and tolerating workload increases        Exercise Goals and Review:     Exercise Goals    Row Name 01/09/17 1124             Exercise Goals   Increase Physical Activity Yes       Intervention Provide advice, education, support and counseling about physical activity/exercise needs.;Develop an individualized  exercise prescription for aerobic and resistive training based on initial evaluation findings, risk stratification, comorbidities and participant's personal goals.       Expected Outcomes Achievement of increased cardiorespiratory fitness and enhanced flexibility, muscular endurance and strength shown through measurements of functional capacity and personal statement of participant.       Increase Strength and Stamina Yes       Intervention Provide advice, education, support and counseling about physical activity/exercise needs.;Develop an individualized exercise prescription for aerobic and resistive training based on initial evaluation findings, risk stratification, comorbidities and participant's personal goals.       Expected Outcomes Achievement of increased cardiorespiratory fitness and enhanced flexibility, muscular endurance and strength shown through measurements of functional capacity and personal statement of participant.          Exercise Goals Re-Evaluation :     Exercise Goals Re-Evaluation    Row Name 01/09/17 1124 01/30/17 1546           Exercise Goal Re-Evaluation   Exercise Goals Review Increase Strenth and Stamina;Increase Physical Activity  Increase Strenth and Stamina;Increase Physical Activity      Comments Pt states that since beginning CR and seeing how she is tolerating workload increase, she feels the most confident (with exericse) than she's felt since her event. She feels stronger, has more energy and she's walking 30-45 min.  Pt states she is still walking 30-45 minutes/day and she's incorporating weight training in her home exercise program 2 days/week.       Expected Outcomes Continue with CR exercise prescription and HEP in order to contine to increase strength, stamina, confidence and overall cardiorespiratory fitness Continue with CR exercise prescription and HEP in order to contine to increase strength, stamina, confidence and overall cardiorespiratory fitness           Discharge Exercise Prescription (Final Exercise Prescription Changes):     Exercise Prescription Changes - 01/27/17 1700      Response to Exercise   Blood Pressure (Admit) 128/72   Blood Pressure (Exercise) 140/70   Blood Pressure (Exit) 116/72   Heart Rate (Admit) 76 bpm   Heart Rate (Exercise) 138 bpm   Heart Rate (Exit) 76 bpm   Rating of Perceived Exertion (Exercise) 12   Duration Progress to 45 minutes of aerobic exercise without signs/symptoms of physical distress   Intensity THRR unchanged     Progression   Progression Continue to progress workloads to maintain intensity without signs/symptoms of physical distress.   Average METs 5.1     Resistance Training   Training Prescription Yes   Weight 4lb   Reps 10-15     NuStep   Level 4   Minutes 10   METs 5     Rower   Level 6   Watts 43   Minutes 10   METs 6.4     Track   Laps 16   Minutes 10   METs 3.79     Home Exercise Plan   Frequency Add 4 additional days to program exercise sessions.      Nutrition:  Target Goals: Understanding of nutrition guidelines, daily intake of sodium 1500mg , cholesterol 200mg , calories 30% from fat and 7% or less from saturated fats, daily to have 5 or more servings of fruits and vegetables.  Biometrics:     Pre Biometrics - 11/12/16 1551      Pre Biometrics   Height 5' (1.524 m)   Weight 111 lb 15.9 oz (50.8 kg)   Waist Circumference 28 inches   Hip Circumference 37 inches   Waist to Hip Ratio 0.76 %   BMI (Calculated) 21.9   Triceps Skinfold 24 mm   % Body Fat 32.9 %   Grip Strength 26 kg   Flexibility 16 in   Single Leg Stand 30 seconds       Nutrition Therapy Plan and Nutrition Goals:     Nutrition Therapy & Goals - 12/11/16 1629      Nutrition Therapy   Diet Therapeutic Lifestyle Changes     Personal Nutrition Goals   Nutrition Goal Pt to maintain her wt while in Cardiac Rehab     Intervention Plan   Intervention Prescribe, educate  and counsel regarding individualized specific dietary modifications aiming towards targeted core components such as weight, hypertension, lipid management, diabetes, heart failure and other comorbidities.   Expected Outcomes Short Term Goal: Understand basic principles of dietary content, such as calories, fat, sodium, cholesterol and nutrients.;Long Term Goal: Adherence to prescribed nutrition plan.      Nutrition Discharge: Nutrition Scores:  Nutrition Assessments - 12/11/16 1629      MEDFICTS Scores   Pre Score 9      Nutrition Goals Re-Evaluation:   Nutrition Goals Re-Evaluation:   Nutrition Goals Discharge (Final Nutrition Goals Re-Evaluation):   Psychosocial: Target Goals: Acknowledge presence or absence of significant depression and/or stress, maximize coping skills, provide positive support system. Participant is able to verbalize types and ability to use techniques and skills needed for reducing stress and depression.  Initial Review & Psychosocial Screening:     Initial Psych Review & Screening - 11/14/16 0906      Family Dynamics   Comments brief psychosocial assesment reveals no further intervention are needed  at this time      Quality of Life Scores:     Quality of Life - 11/12/16 1353      Quality of Life Scores   Health/Function Pre 23.39 %   Socioeconomic Pre 28.58 %   Psych/Spiritual Pre 25.71 %   Family Pre 30 %   GLOBAL Pre 25.77 %      PHQ-9: Recent Review Flowsheet Data    Depression screen [facility] 2/9 11/18/2016 04/28/2014 02/10/2013   Decreased Interest 0 0 0   Down, Depressed, Hopeless 0 0 0   PHQ - 2 Score 0 0 0     Interpretation of Total Score  Total Score Depression Severity:  1-4 = Minimal depression, 5-9 = Mild depression, 10-14 = Moderate depression, 15-19 = Moderately severe depression, 20-27 = Severe depression   Psychosocial Evaluation and Intervention:   Psychosocial Re-Evaluation:     Psychosocial Re-Evaluation     Row Name 12/13/16 0926 01/09/17 1507 02/05/17 1745         Psychosocial Re-Evaluation   Current issues with  - None Identified None Identified     Interventions Encouraged to attend Cardiac Rehabilitation for the exercise Encouraged to attend Cardiac Rehabilitation for the exercise Encouraged to attend Cardiac Rehabilitation for the exercise     Continue Psychosocial Services  No No Follow up required No Follow up required        Psychosocial Discharge (Final Psychosocial Re-Evaluation):     Psychosocial Re-Evaluation - 02/05/17 1745      Psychosocial Re-Evaluation   Current issues with None Identified   Interventions Encouraged to attend Cardiac Rehabilitation for the exercise   Continue Psychosocial Services  No Follow up required      Vocational Rehabilitation: Provide vocational rehab assistance to qualifying candidates.   Vocational Rehab Evaluation & Intervention:     Vocational Rehab - 11/14/16 0859      Initial Vocational Rehab Evaluation & Intervention   Assessment shows need for Vocational Rehabilitation No      Education: Education Goals: Education classes will be provided on a weekly basis, covering required topics. Participant will state understanding/return demonstration of topics presented.  Learning Barriers/Preferences:     Learning Barriers/Preferences - 11/12/16 1356      Learning Barriers/Preferences   Learning Barriers Sight  wears reading glasses   Learning Preferences Pictoral;Written Material      Education Topics: Count Your Pulse:  -Group instruction provided by verbal instruction, demonstration, patient participation and written materials to support subject.  Instructors address importance of being able to find your pulse and how to count your pulse when at home without a heart monitor.  Patients get hands on experience counting their pulse with staff help and individually.   CARDIAC REHAB PHASE II EXERCISE from 02/05/2017 in [facility] CARDIAC  REHAB  Date  01/10/17  Instruction Review Code  2- meets goals/outcomes      Heart Attack, Angina, and Risk Factor Modification:  -Group instruction provided by verbal instruction, video, and written materials to support subject.  Instructors address signs and symptoms of angina and heart attacks.    Also discuss risk factors for heart disease and how to make changes to improve heart health risk factors.   CARDIAC REHAB PHASE II EXERCISE from 02/05/2017 in [facility] CARDIAC REHAB  Date  02/05/17  Instruction Review Code  2- meets goals/outcomes      Functional Fitness:  -Group instruction provided by verbal instruction, demonstration, patient participation, and written materials to support subject.  Instructors address safety measures for doing things around the house.  Discuss how to get up and down off the floor, how to pick things up properly, how to safely get out of a chair without assistance, and balance training.   Meditation and Mindfulness:  -Group instruction provided by verbal instruction, patient participation, and written materials to support subject.  Instructor addresses importance of mindfulness and meditation practice to help reduce stress and improve awareness.  Instructor also leads participants through a meditation exercise.    CARDIAC REHAB PHASE II EXERCISE from 02/05/2017 in [facility] CARDIAC REHAB  Date  01/01/17  Instruction Review Code  2- meets goals/outcomes      Stretching for Flexibility and Mobility:  -Group instruction provided by verbal instruction, patient participation, and written materials to support subject.  Instructors lead participants through series of stretches that are designed to increase flexibility thus improving mobility.  These stretches are additional exercise for major muscle groups that are typically performed during regular warm up and cool down.   CARDIAC REHAB PHASE II  EXERCISE from 02/05/2017 in [facility] CARDIAC REHAB  Date  01/31/17  Instruction Review Code  R- Review/reinforce      Hands Only CPR Anytime:  -Group instruction provided by verbal instruction, video, patient participation and written materials to support subject.  Instructors co-teach with [facility] video for hands only CPR.  Participants get hands on experience with mannequins.   Nutrition I class: Heart Healthy Eating:  -Group instruction provided by PowerPoint slides, verbal discussion, and written materials to support subject matter. The instructor gives an explanation and review of the Therapeutic Lifestyle Changes diet recommendations, which includes a discussion on lipid goals, dietary fat, sodium, fiber, plant stanol/sterol esters, sugar, and the components of a well-balanced, healthy diet.   CARDIAC REHAB PHASE II EXERCISE from 02/05/2017 in [facility] CARDIAC REHAB  Date  12/11/16  Educator  RD  Instruction Review Code  Not applicable [class handouts given]      Nutrition II class: Lifestyle Skills:  -Group instruction provided by PowerPoint slides, verbal discussion, and written materials to support subject matter. The instructor gives an explanation and review of label reading, grocery shopping for heart health, heart healthy recipe modifications, and ways to make healthier choices when eating out.   CARDIAC REHAB PHASE II EXERCISE from 02/05/2017 in [facility] CARDIAC REHAB  Date  12/11/16  Educator  RD  Instruction Review Code  Not applicable [class handouts given]      Diabetes Question & Answer:  -Group instruction provided by PowerPoint slides, verbal discussion, and written materials to support subject matter. The instructor gives an explanation and review of diabetes co-morbidities, pre- and post-prandial blood glucose goals, pre-exercise blood glucose goals, signs,  symptoms, and treatment of hypoglycemia and  hyperglycemia, and foot care basics.   Diabetes Blitz:  -Group instruction provided by PowerPoint slides, verbal discussion, and written materials to support subject matter. The instructor gives an explanation and review of the physiology behind type 1 and type 2 diabetes, diabetes medications and rational behind using different medications, pre- and post-prandial blood glucose recommendations and Hemoglobin A1c goals, diabetes diet, and exercise including blood glucose guidelines for exercising safely.    Portion Distortion:  -Group instruction provided by PowerPoint slides, verbal discussion, written materials, and food models to support subject matter. The instructor gives an explanation of serving size versus portion size, changes in portions sizes over the last 20 years, and what consists of a serving from each food group.   CARDIAC REHAB PHASE II EXERCISE from 02/05/2017 in [facility] CARDIAC REHAB  Date  01/08/17  Educator  RD  Instruction Review Code  2- meets goals/outcomes      Stress Management:  -Group instruction provided by verbal instruction, video, and written materials to support subject matter.  Instructors review role of stress in heart disease and how to cope with stress positively.     CARDIAC REHAB PHASE II EXERCISE from 02/05/2017 in [facility] CARDIAC REHAB  Date  01/15/17  Instruction Review Code  2- meets goals/outcomes      Exercising on Your Own:  -Group instruction provided by verbal instruction, power point, and written materials to support subject.  Instructors discuss benefits of exercise, components of exercise, frequency and intensity of exercise, and end points for exercise.  Also discuss use of nitroglycerin and activating EMS.  Review options of places to exercise outside of rehab.  Review guidelines for sex with heart disease.   CARDIAC REHAB PHASE II EXERCISE from 02/05/2017 in [facility] CARDIAC REHAB   Date  01/29/17  Educator  Rosine Door  Instruction Review Code  2- meets goals/outcomes      Cardiac Drugs I:  -Group instruction provided by verbal instruction and written materials to support subject.  Instructor reviews cardiac drug classes: antiplatelets, anticoagulants, beta blockers, and statins.  Instructor discusses reasons, side effects, and lifestyle considerations for each drug class.   CARDIAC REHAB PHASE II EXERCISE from 02/05/2017 in [facility] CARDIAC REHAB  Date  12/25/16  Instruction Review Code  2- meets goals/outcomes      Cardiac Drugs II:  -Group instruction provided by verbal instruction and written materials to support subject.  Instructor reviews cardiac drug classes: angiotensin converting enzyme inhibitors (ACE-I), angiotensin II receptor blockers (ARBs), nitrates, and calcium channel blockers.  Instructor discusses reasons, side effects, and lifestyle considerations for each drug class.   CARDIAC REHAB PHASE II EXERCISE from 02/05/2017 in [facility] CARDIAC REHAB  Date  01/22/17  Instruction Review Code  2- meets goals/outcomes      Anatomy and Physiology of the Circulatory System:  -Group instruction provided by verbal instruction, video, and written materials to support subject.  Reviews functional anatomy of heart, how it relates to various diagnoses, and what role the heart plays in the overall system.   CARDIAC REHAB PHASE II EXERCISE from 02/05/2017 in [facility] CARDIAC REHAB  Date  12/18/16  Instruction Review Code  2- meets goals/outcomes      Knowledge Questionnaire Score:     Knowledge Questionnaire Score - 11/12/16 1350      Knowledge Questionnaire Score   Pre Score 23/24  Core Components/Risk Factors/Patient Goals at Admission:     Personal Goals and Risk Factors at Admission - 11/14/16 0847      Core Components/Risk Factors/Patient Goals on Admission   Improve shortness  of breath with ADL's --   Intervention --   Expected Outcomes --      Core Components/Risk Factors/Patient Goals Review:      Goals and Risk Factor Review    Row Name 12/09/16 1635             Core Components/Risk Factors/Patient Goals Review   Personal Goals Review Increase Strength and Stamina       Review Pt states that she's still skeptical about her health, and how hard she is able to push herself.  She states that she just want to get back to her "normal self" where she is able to exercise and do things w/o being preoccupied about her heart health       Expected Outcomes Continue with exercise routine and gradual workload increases in order to build strength and confidence in cardiorespiratory fitness level          Core Components/Risk Factors/Patient Goals at Discharge (Final Review):      Goals and Risk Factor Review - 12/09/16 1635      Core Components/Risk Factors/Patient Goals Review   Personal Goals Review Increase Strength and Stamina   Review Pt states that she's still skeptical about her health, and how hard she is able to push herself.  She states that she just want to get back to her "normal self" where she is able to exercise and do things w/o being preoccupied about her heart health   Expected Outcomes Continue with exercise routine and gradual workload increases in order to build strength and confidence in cardiorespiratory fitness level      ITP Comments:     ITP Comments    Row Name 11/12/16 1354           ITP Comments Dr. Driscilla Moats, Medical Director          Comments: Jakayla is making expected progress toward personal goals after completing 27 sessions. Recommend continued exercise and life style modification education including  stress management and relaxation techniques to decrease cardiac risk profile. Latasia continues to do well with exercise.[name], RN,BSN 02/05/2017 5:49 PM

## 2017-02-06 ENCOUNTER — Encounter: Payer: Self-pay | Admitting: Gastroenterology

## 2017-02-07 ENCOUNTER — Encounter (HOSPITAL_COMMUNITY)
Admission: RE | Admit: 2017-02-07 | Discharge: 2017-02-07 | Disposition: A | Discharge status: Home / Self Care | Payer: Commercial Managed Care - HMO | Source: Ambulatory Visit | Attending: Cardiology | Admitting: Cardiology

## 2017-02-07 DIAGNOSIS — Z951 Presence of aortocoronary bypass graft: Secondary | ICD-10-CM

## 2017-02-07 DIAGNOSIS — Z09 Encounter for follow-up examination after completed treatment for conditions other than malignant neoplasm: Secondary | ICD-10-CM | POA: Diagnosis not present

## 2017-02-10 ENCOUNTER — Encounter (HOSPITAL_COMMUNITY)
Admission: RE | Admit: 2017-02-10 | Discharge: 2017-02-10 | Disposition: A | Discharge status: Home / Self Care | Payer: Commercial Managed Care - HMO | Source: Ambulatory Visit | Attending: Cardiology | Admitting: Cardiology

## 2017-02-10 DIAGNOSIS — I2510 Atherosclerotic heart disease of native coronary artery without angina pectoris: Secondary | ICD-10-CM | POA: Diagnosis present

## 2017-02-10 DIAGNOSIS — Z09 Encounter for follow-up examination after completed treatment for conditions other than malignant neoplasm: Secondary | ICD-10-CM | POA: Insufficient documentation

## 2017-02-10 DIAGNOSIS — Z79899 Other long term (current) drug therapy: Secondary | ICD-10-CM | POA: Insufficient documentation

## 2017-02-10 DIAGNOSIS — Z951 Presence of aortocoronary bypass graft: Secondary | ICD-10-CM | POA: Diagnosis not present

## 2017-02-12 ENCOUNTER — Encounter (HOSPITAL_COMMUNITY)
Admission: RE | Admit: 2017-02-12 | Discharge: 2017-02-12 | Disposition: A | Discharge status: Home / Self Care | Payer: Commercial Managed Care - HMO | Source: Ambulatory Visit | Attending: Cardiology | Admitting: Cardiology

## 2017-02-12 DIAGNOSIS — Z951 Presence of aortocoronary bypass graft: Secondary | ICD-10-CM

## 2017-02-12 DIAGNOSIS — Z09 Encounter for follow-up examination after completed treatment for conditions other than malignant neoplasm: Secondary | ICD-10-CM | POA: Diagnosis not present

## 2017-02-14 ENCOUNTER — Encounter (HOSPITAL_COMMUNITY)
Admission: RE | Admit: 2017-02-14 | Discharge: 2017-02-14 | Disposition: A | Discharge status: Home / Self Care | Payer: Commercial Managed Care - HMO | Source: Ambulatory Visit | Attending: Cardiology | Admitting: Cardiology

## 2017-02-14 DIAGNOSIS — Z09 Encounter for follow-up examination after completed treatment for conditions other than malignant neoplasm: Secondary | ICD-10-CM | POA: Diagnosis not present

## 2017-02-14 DIAGNOSIS — Z951 Presence of aortocoronary bypass graft: Secondary | ICD-10-CM

## 2017-02-17 ENCOUNTER — Encounter (HOSPITAL_COMMUNITY)
Admission: RE | Admit: 2017-02-17 | Discharge: 2017-02-17 | Disposition: A | Discharge status: Home / Self Care | Payer: Commercial Managed Care - HMO | Source: Ambulatory Visit | Attending: Cardiology | Admitting: Cardiology

## 2017-02-17 DIAGNOSIS — Z951 Presence of aortocoronary bypass graft: Secondary | ICD-10-CM

## 2017-02-17 DIAGNOSIS — Z09 Encounter for follow-up examination after completed treatment for conditions other than malignant neoplasm: Secondary | ICD-10-CM | POA: Diagnosis not present

## 2017-02-19 ENCOUNTER — Encounter (HOSPITAL_COMMUNITY)
Admission: RE | Admit: 2017-02-19 | Discharge: 2017-02-19 | Disposition: A | Discharge status: Home / Self Care | Payer: Commercial Managed Care - HMO | Source: Ambulatory Visit | Attending: Cardiology | Admitting: Cardiology

## 2017-02-19 DIAGNOSIS — Z951 Presence of aortocoronary bypass graft: Secondary | ICD-10-CM

## 2017-02-19 DIAGNOSIS — Z09 Encounter for follow-up examination after completed treatment for conditions other than malignant neoplasm: Secondary | ICD-10-CM | POA: Diagnosis not present

## 2017-02-21 ENCOUNTER — Encounter (HOSPITAL_COMMUNITY): Payer: Commercial Managed Care - HMO

## 2017-02-24 ENCOUNTER — Encounter (HOSPITAL_COMMUNITY)
Admission: RE | Admit: 2017-02-24 | Discharge: 2017-02-24 | Disposition: A | Discharge status: Home / Self Care | Payer: Commercial Managed Care - HMO | Source: Ambulatory Visit | Attending: Cardiology | Admitting: Cardiology

## 2017-02-24 DIAGNOSIS — Z951 Presence of aortocoronary bypass graft: Secondary | ICD-10-CM

## 2017-02-24 NOTE — Progress Notes (Signed)
Discharge Summary  Patient Details  Name: Cindy Blankenship MRN: [id number] Date of Birth: [birth year] Referring Provider:     CARDIAC REHAB PHASE II ORIENTATION from 11/12/2016 in [place]  Referring Provider  Dr Ulyses Southward at [facility] [Dr Ulyses Southward  at [place]       Number of Visits: 35  Reason for Discharge:  Patient independent in their exercise.     Smoking History:  History  Smoking Status  . Former Smoker  . Quit date: 02/26/1986  Smokeless Tobacco  . 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  ADL UCSD:   Initial Exercise Prescription:     Initial Exercise Prescription - 11/12/16 1500      Date of Initial Exercise RX and Referring Provider   Date 11/12/16   Referring Provider Dr Ulyses Southward at [facility]  Dr Ulyses Southward  at [facility]   Level 0.5   Minutes 10   METs 2.89     NuStep   Level 3   Minutes 10   METs 2     Track   Laps 10   Minutes 10   METs 2.74     Prescription Details   Frequency (times per week) 3   Duration Progress to 30 minutes of continuous aerobic without signs/symptoms of physical distress     Intensity   THRR 40-80% of Max Heartrate 62-125   Ratings of Perceived Exertion 11-13     Progression   Progression Continue progressive overload as per policy without signs/symptoms or physical distress.     Resistance Training   Training Prescription Yes   Weight 2lbs   Reps 10-12      Discharge Exercise Prescription (Final Exercise Prescription Changes):     Exercise Prescription Changes - 01/27/17 1700      Response to Exercise   Blood Pressure (Admit) 128/72   Blood Pressure (Exercise) 140/70   Blood Pressure (Exit) 116/72   Heart Rate (Admit) 76 bpm   Heart Rate (Exercise) 138 bpm   Heart Rate (Exit) 76 bpm   Rating of Perceived Exertion (Exercise) 12   Duration Progress to 45 minutes of aerobic  exercise without signs/symptoms of physical distress   Intensity THRR unchanged     Progression   Progression Continue to progress workloads to maintain intensity without signs/symptoms of physical distress.   Average METs 5.1     Resistance Training   Training Prescription Yes   Weight 4lb   Reps 10-15     NuStep   Level 4   Minutes 10   METs 5     Rower   Level 6   Watts 43   Minutes 10   METs 6.4     Track   Laps 16   Minutes 10   METs 3.79     Home Exercise Plan   Frequency Add 4 additional days to program exercise sessions.      Functional Capacity:     6 Minute Walk    Row Name 11/12/16 1550 02/10/17 1637       6 Minute Walk   Phase Initial Discharge    Distance 1695 feet 2246 feet    Distance % Change  - 24.5 %    Walk Time 6 minutes 6 minutes    # of Rest Breaks 0 0    MPH 3.2  4.3    METS 4.13 5.5    RPE 11 10    VO2 Peak 14.48 19.1    Symptoms No No    Resting HR 76 bpm 86 bpm    Resting BP 122/60 128/70    Max Ex. HR 108 bpm 145 bpm    Max Ex. BP 134/74 136/62    2 Minute Post BP 114/70 124/74       Psychological, QOL, Others - Outcomes: PHQ 2/9: Depression screen [name] 2/9 02/24/2017 11/18/2016 04/28/2014 02/10/2013  Decreased Interest 0 0 0 0  Down, Depressed, Hopeless 0 0 0 0  PHQ - 2 Score 0 0 0 0    Quality of Life:     Quality of Life - 03/12/17 1704      Quality of Life Scores   Health/Function Pre 23.39 %   Health/Function Post 28.8 %   Health/Function % Change 23.13 %   Socioeconomic Pre 28.58 %   Socioeconomic Post 30 %   Socioeconomic % Change  4.97 %   Psych/Spiritual Pre 25.71 %   Psych/Spiritual Post 26.57 %   Psych/Spiritual % Change 3.35 %   Family Pre 30 %   Family Post 30 %   Family % Change 0 %   GLOBAL Pre 25.77 %   GLOBAL Post 28.76 %   GLOBAL % Change 11.6 %      Personal Goals: Goals established at orientation with interventions provided to work toward goal.     Personal Goals and Risk Factors at  Admission - 11/14/16 0847      Core Components/Risk Factors/Patient Goals on Admission   Improve shortness of breath with ADL's --   Intervention --   Expected Outcomes --       Personal Goals Discharge:     Goals and Risk Factor Review    Row Name 12/09/16 1635             Core Components/Risk Factors/Patient Goals Review   Personal Goals Review Increase Strength and Stamina       Review Pt states that she's still skeptical about her health, and how hard she is able to push herself.  She states that she just want to get back to her "normal self" where she is able to exercise and do things w/o being preoccupied about her heart health       Expected Outcomes Continue with exercise routine and gradual workload increases in order to build strength and confidence in cardiorespiratory fitness level          Nutrition & Weight - Outcomes:     Pre Biometrics - 11/12/16 1551      Pre Biometrics   Height 5' (1.524 m)   Weight 111 lb 15.9 oz (50.8 kg)   Waist Circumference 28 inches   Hip Circumference 37 inches   Waist to Hip Ratio 0.76 %   BMI (Calculated) 21.9   Triceps Skinfold 24 mm   % Body Fat 32.9 %   Grip Strength 26 kg   Flexibility 16 in   Single Leg Stand 30 seconds         Post Biometrics - 03/20/17 1218       Post  Biometrics   Height 5' (1.524 m)   Weight 115 lb 4.8 oz (52.3 kg)   Waist Circumference 24 inches   Hip Circumference 37 inches   Waist to Hip Ratio 0.65 %   BMI (Calculated) 22.6   Triceps Skinfold 26 mm   %  Body Fat 32.2 %   Grip Strength 30 kg   Flexibility 20.5 in   Single Leg Stand 30 seconds      Nutrition:     Nutrition Therapy & Goals - 12/11/16 1629      Nutrition Therapy   Diet Therapeutic Lifestyle Changes     Personal Nutrition Goals   Nutrition Goal Pt to maintain her wt while in [name], educate and counsel regarding individualized specific dietary  modifications aiming towards targeted core components such as weight, hypertension, lipid management, diabetes, heart failure and other comorbidities.   Expected Outcomes Short Term Goal: Understand basic principles of dietary content, such as calories, fat, sodium, cholesterol and nutrients.;Long Term Goal: Adherence to prescribed nutrition plan.      Nutrition Discharge:     Nutrition Assessments - 03/18/17 1405      MEDFICTS Scores   Pre Score 9   Post Score 3   Score Difference -6      Education Questionnaire Score:     Knowledge Questionnaire Score - 03/12/17 1654      Knowledge Questionnaire Score   Post Score 23/24      Goals reviewed with patient; copy given to patient.Cindy Blankenship graduated from cardiac rehab program today with completion of 36 exercise sessions in Phase II. Pt maintained good attendance and progressed nicely during his participation in rehab as evidenced by increased MET level.   Medication list reconciled. Repeat  PHQ score- 0 .  Pt has made significant lifestyle changes and should be commended for his success. Pt feels she has achieved her goals during cardiac rehab.   Cindy Blankenship plans to continue exercise at the [facility] and walking on her own.Barnet Pall, RN,BSN 03/20/2017 12:21 PM

## 2017-02-26 ENCOUNTER — Encounter (HOSPITAL_COMMUNITY): Payer: Commercial Managed Care - HMO

## 2017-02-28 ENCOUNTER — Encounter (HOSPITAL_COMMUNITY): Payer: Commercial Managed Care - HMO

## 2017-03-03 ENCOUNTER — Encounter (HOSPITAL_COMMUNITY): Payer: Commercial Managed Care - HMO

## 2017-03-05 ENCOUNTER — Encounter (HOSPITAL_COMMUNITY): Payer: Commercial Managed Care - HMO

## 2017-03-07 ENCOUNTER — Encounter (HOSPITAL_COMMUNITY): Payer: Commercial Managed Care - HMO

## 2017-03-10 ENCOUNTER — Encounter (HOSPITAL_COMMUNITY): Payer: Commercial Managed Care - HMO

## 2017-03-12 ENCOUNTER — Encounter (HOSPITAL_COMMUNITY): Payer: Commercial Managed Care - HMO

## 2017-03-14 ENCOUNTER — Encounter (HOSPITAL_COMMUNITY): Payer: Commercial Managed Care - HMO

## 2017-03-17 ENCOUNTER — Encounter (HOSPITAL_COMMUNITY): Payer: Commercial Managed Care - HMO

## 2017-03-18 NOTE — Addendum Note (Signed)
Encounter addended by: Jacques EarthlyFranko, Alonda Weaber Brewbaker, RD on: 03/18/2017  2:08 PM<BR>    Actions taken: Flowsheet data copied forward, Visit Navigator Flowsheet section accepted

## 2017-03-20 ENCOUNTER — Encounter (HOSPITAL_COMMUNITY): Payer: Self-pay | Admitting: *Deleted

## 2017-10-09 ENCOUNTER — Encounter: Payer: Self-pay | Admitting: Gastroenterology

## 2017-11-17 ENCOUNTER — Encounter: Payer: Self-pay | Admitting: Gynecology

## 2017-11-27 ENCOUNTER — Ambulatory Visit (AMBULATORY_SURGERY_CENTER): Payer: Self-pay

## 2017-11-27 VITALS — Ht 60.0 in | Wt 120.4 lb

## 2017-11-27 DIAGNOSIS — Z8371 Family history of colonic polyps: Secondary | ICD-10-CM

## 2017-11-27 DIAGNOSIS — Z800 Family history of malignant neoplasm of digestive organs: Secondary | ICD-10-CM

## 2017-11-27 MED ORDER — NA SULFATE-K SULFATE-MG SULF 17.5-3.13-1.6 GM/177ML PO SOLN: 1.0000 | Freq: Once | ORAL | 0 refills | Status: AC

## 2017-11-27 NOTE — Progress Notes (Signed)
Per pt, no allergies to soy or egg products.Pt not taking any weight loss meds or using  O2 at home.  Pt refused emmi video. 

## 2017-12-03 ENCOUNTER — Encounter: Payer: Self-pay | Admitting: Gastroenterology

## 2017-12-05 ENCOUNTER — Other Ambulatory Visit: Payer: Self-pay | Admitting: Gynecology

## 2017-12-08 ENCOUNTER — Ambulatory Visit (INDEPENDENT_AMBULATORY_CARE_PROVIDER_SITE_OTHER): Payer: 59 | Admitting: Gynecology

## 2017-12-08 ENCOUNTER — Encounter: Payer: Self-pay | Admitting: Gynecology

## 2017-12-08 VITALS — BP 118/76 | Ht 60.0 in | Wt 120.0 lb

## 2017-12-08 DIAGNOSIS — Z01411 Encounter for gynecological examination (general) (routine) with abnormal findings: Secondary | ICD-10-CM | POA: Diagnosis not present

## 2017-12-08 DIAGNOSIS — M8580 Other specified disorders of bone density and structure, unspecified site: Secondary | ICD-10-CM | POA: Diagnosis not present

## 2017-12-08 DIAGNOSIS — N952 Postmenopausal atrophic vaginitis: Secondary | ICD-10-CM | POA: Diagnosis not present

## 2017-12-08 DIAGNOSIS — Z79890 Hormone replacement therapy (postmenopausal): Secondary | ICD-10-CM

## 2017-12-08 MED ORDER — ESTRADIOL 0.05 MG/24HR TD PTTW: 1.0000 | MEDICATED_PATCH | TRANSDERMAL | 12 refills | Status: DC

## 2017-12-08 NOTE — Patient Instructions (Signed)
Follow-up for bone density as scheduled.  Follow-up in 1 year for annual exam, sooner if any issues. 

## 2017-12-08 NOTE — Progress Notes (Signed)
    Cindy LullFreda Blankenship [birth year] [id number]        67 y.o.  [id number] for annual gynecologic exam.  Doing well from a gynecologic standpoint.  Past medical history,surgical history, problem list, medications, allergies, family history and social history were all reviewed and documented as reviewed in the EPIC chart.  ROS:  Performed with pertinent positives and negatives included in the history, assessment and plan.   Additional significant findings : None   Exam: Kennon PortelaKim Gardner assistant Vitals:   12/08/17 0824  BP: 118/76  Weight: 120 lb (54.4 kg)  Height: 5' (1.524 m)   Body mass index is 23.44 kg/m.  General appearance:  Normal affect, orientation and appearance. Skin: Grossly normal HEENT: Without gross lesions.  No cervical or supraclavicular adenopathy. Thyroid normal.  Lungs:  Clear without wheezing, rales or rhonchi Cardiac: RR, without RMG Abdominal:  Soft, nontender, without masses, guarding, rebound, organomegaly or hernia Breasts:  Examined lying and sitting without masses, retractions, discharge or axillary adenopathy. Pelvic:  Ext, BUS, Vagina: With atrophic changes  Adnexa: Without masses or tenderness    Anus and perineum: Normal   Rectovaginal: Normal sphincter tone without palpated masses or tenderness.    Assessment/Plan:  67 y.o. [id number] female for annual gynecologic exam status post hysterectomy, LSO and vaginal repair surgery per Dr. Terrilee FilesMacDermid all as separate procedures.   1. Postmenopausal/atrophic genital changes/HRT.  Continues on Vivelle 0.05 mg patch twice weekly.  Had discontinued but had unacceptable hot flushes and sweats.  Has a history of coronary disease status post bypass.  States that her cardiologist knows she is on HRT and approves of it.  Have discussed extensively the risks given her particular situation in the past.  Risks again reviewed to include thrombosis and the breast cancer issue.  The patient strongly wants to continue and I refilled her  times 1 year. 2. Osteopenia.  DEXA 2017 T score -1.9 FRAX 9% / 1.2%.  Stable from prior DEXA.  Options to schedule now or wait another year to reviewed and patient wants to go ahead and schedule now.  Follow-up for DEXA as scheduled. 3. Mammography 11/2017.  Continue with annual mammography next year.  Breast exam normal today. 4. Colonoscopy 2013.  Repeat at their recommended interval. 5. Pap smear 2017.  No Pap smear done today.  History of VAIN 2002.  Pap smears normal since.  Will plan repeat Pap smear at 3-year interval. 6. Health maintenance.  No routine lab work done as patient does this elsewhere.  Follow-up 1 year, sooner as needed.   Dara Lordsimothy P Cyrstal Leitz MD, 8:41 AM 12/08/2017

## 2017-12-11 ENCOUNTER — Encounter: Payer: Self-pay | Admitting: Gastroenterology

## 2017-12-11 ENCOUNTER — Other Ambulatory Visit: Payer: Self-pay

## 2017-12-11 ENCOUNTER — Ambulatory Visit (AMBULATORY_SURGERY_CENTER): Payer: 59 | Admitting: Gastroenterology

## 2017-12-11 VITALS — BP 114/53 | HR 62 | Temp 98.6°F | Resp 15 | Ht 60.0 in | Wt 120.0 lb

## 2017-12-11 DIAGNOSIS — Z1212 Encounter for screening for malignant neoplasm of rectum: Secondary | ICD-10-CM | POA: Diagnosis not present

## 2017-12-11 DIAGNOSIS — Z1211 Encounter for screening for malignant neoplasm of colon: Secondary | ICD-10-CM

## 2017-12-11 DIAGNOSIS — Z800 Family history of malignant neoplasm of digestive organs: Secondary | ICD-10-CM | POA: Diagnosis present

## 2017-12-11 MED ORDER — SODIUM CHLORIDE 0.9 % IV SOLN: 500.0000 mL | Freq: Once | INTRAVENOUS | Status: DC

## 2017-12-11 NOTE — Progress Notes (Signed)
Report to PACU, RN, vss, BBS= Clear.  

## 2017-12-11 NOTE — Patient Instructions (Signed)
Impression/recommendations:  Diverticulosis (handout given)  YOU HAD AN ENDOSCOPIC PROCEDURE TODAY AT THE [facility]:   Refer to the procedure report that was given to you for any specific questions about what was found during the examination.  If the procedure report does not answer your questions, please call your gastroenterologist to clarify.  If you requested that your care partner not be given the details of your procedure findings, then the procedure report has been included in a sealed envelope for you to review at your convenience later.  YOU SHOULD EXPECT: Some feelings of bloating in the abdomen. Passage of more gas than usual.  Walking can help get rid of the air that was put into your GI tract during the procedure and reduce the bloating. If you had a lower endoscopy (such as a colonoscopy or flexible sigmoidoscopy) you may notice spotting of blood in your stool or on the toilet paper. If you underwent a bowel prep for your procedure, you may not have a normal bowel movement for a few days.  Please Note:  You might notice some irritation and congestion in your nose or some drainage.  This is from the oxygen used during your procedure.  There is no need for concern and it should clear up in a day or so.  SYMPTOMS TO REPORT IMMEDIATELY:   Following lower endoscopy (colonoscopy or flexible sigmoidoscopy):  Excessive amounts of blood in the stool  Significant tenderness or worsening of abdominal pains  Swelling of the abdomen that is new, acute  Fever of 100F or higher  For urgent or emergent issues, a gastroenterologist can be reached at any hour by calling [phone].   DIET:  We do recommend a small meal at first, but then you may proceed to your regular diet.  Drink plenty of fluids but you should avoid alcoholic beverages for 24 hours.  ACTIVITY:  You should plan to take it easy for the rest of today and you should NOT DRIVE or use heavy machinery until  tomorrow (because of the sedation medicines used during the test).    FOLLOW UP: Our staff will call the number listed on your records the next business day following your procedure to check on you and address any questions or concerns that you may have regarding the information given to you following your procedure. If we do not reach you, we will leave a message.  However, if you are feeling well and you are not experiencing any problems, there is no need to return our call.  We will assume that you have returned to your regular daily activities without incident.  If any biopsies were taken you will be contacted by phone or by letter within the next 1-3 weeks.  Please call us at [phone] if you have not heard about the biopsies in 3 weeks.    SIGNATURES/CONFIDENTIALITY: You and/or your care partner have signed paperwork which will be entered into your electronic medical record.  These signatures attest to the fact that that the information above on your After Visit Summary has been reviewed and is understood.  Full responsibility of the confidentiality of this discharge information lies with you and/or your care-partner. 

## 2017-12-11 NOTE — Op Note (Signed)
[facility] Patient Name: Cindy Blankenship Procedure Date: 12/11/2017 9:30 AM MRN: [id number] Endoscopist: Viviann Spare P. Armbruster MD, MD Age: 67 Referring MD:  Date of Birth: 1951/02/24 Gender: Female Account #: [id number] Procedure:                Colonoscopy Indications:              Screening in patient at increased risk: Family                            history of 1st-degree relative with colorectal                            cancer (mother diagnosed age suspected to be around                            early 54s) Medicines:                Monitored Anesthesia Care Procedure:                Pre-Anesthesia Assessment:                           - Prior to the procedure, a History and Physical                            was performed, and patient medications and                            allergies were reviewed. The patient's tolerance of                            previous anesthesia was also reviewed. The risks                            and benefits of the procedure and the sedation                            options and risks were discussed with the patient.                            All questions were answered, and informed consent                            was obtained. Prior Anticoagulants: The patient has                            taken no previous anticoagulant or antiplatelet                            agents. ASA Grade Assessment: II - A patient with                            mild systemic disease. After reviewing the risks  and benefits, the patient was deemed in                            satisfactory condition to undergo the procedure.                           After obtaining informed consent, the colonoscope                            was passed under direct vision. Throughout the                            procedure, the patient's blood pressure, pulse, and                            oxygen saturations were monitored  continuously. The                            Colonoscope was introduced through the anus and                            advanced to the the cecum, identified by                            appendiceal orifice and ileocecal valve. The                            colonoscopy was performed without difficulty. The                            patient tolerated the procedure well. The quality                            of the bowel preparation was good. The ileocecal                            valve, appendiceal orifice, and rectum were                            photographed. Scope In: 9:41:40 AM Scope Out: 9:56:19 AM Scope Withdrawal Time: 0 hours 10 minutes 36 seconds  Total Procedure Duration: 0 hours 14 minutes 39 seconds  Findings:                 The perianal and digital rectal examinations were                            normal.                           A few small-mouthed diverticula were found in the                            sigmoid colon.  The exam was otherwise without abnormality on                            direct and retroflexion views. No polyps. Complications:            No immediate complications. Estimated blood loss:                            None. Estimated Blood Loss:     Estimated blood loss: none. Impression:               - Diverticulosis in the sigmoid colon.                           - The examination was otherwise normal on direct                            and retroflexion views.                           - No polyps. Recommendation:           - Patient has a contact number available for                            emergencies. The signs and symptoms of potential                            delayed complications were discussed with the                            patient. Return to normal activities tomorrow.                            Written discharge instructions were provided to the                            patient.                            - Resume previous diet.                           - Continue present medications.                           - Guidelines recommend repeat colonoscopy in 10                            years for screening purposes if your family member                            was diagnosed at age > 60 years. If your mother was                            diagnosed in her early 22s, this would be the case,  but if you would prefer to continue screening at 5                            year intervals we can consider that. We can discuss                            how you would like to proceed. Viviann Spare P. Armbruster MD, MD 12/11/2017 10:01:19 AM This report has been signed electronically.

## 2017-12-12 ENCOUNTER — Telehealth: Payer: Self-pay

## 2017-12-12 NOTE — Telephone Encounter (Signed)
Attempted to reach pt. With follow up call following endoscopic procedure 12/11/2017.  LM on pt. Ans. Machine.  Will try to reach pt. Again later today. 

## 2017-12-12 NOTE — Telephone Encounter (Signed)
Attempted to reach pt. With follow up call following endoscopic procedure 12/11/2017.  LM on pt. Ans. Machine to call us if she has any questions or concerns.

## 2017-12-25 ENCOUNTER — Other Ambulatory Visit: Payer: Self-pay | Admitting: Gynecology

## 2017-12-25 ENCOUNTER — Ambulatory Visit (INDEPENDENT_AMBULATORY_CARE_PROVIDER_SITE_OTHER): Payer: 59

## 2017-12-25 DIAGNOSIS — M8589 Other specified disorders of bone density and structure, multiple sites: Secondary | ICD-10-CM

## 2017-12-25 DIAGNOSIS — M8580 Other specified disorders of bone density and structure, unspecified site: Secondary | ICD-10-CM

## 2017-12-26 ENCOUNTER — Encounter: Payer: Self-pay | Admitting: Gynecology

## 2018-11-18 ENCOUNTER — Encounter: Payer: Self-pay | Admitting: Gynecology

## 2018-11-27 ENCOUNTER — Other Ambulatory Visit: Payer: Self-pay | Admitting: Gynecology

## 2018-12-09 ENCOUNTER — Encounter: Payer: Self-pay | Admitting: Gynecology

## 2018-12-09 ENCOUNTER — Ambulatory Visit: Payer: 59 | Admitting: Gynecology

## 2018-12-09 VITALS — BP 118/76 | Ht 60.0 in | Wt 124.0 lb

## 2018-12-09 DIAGNOSIS — Z01419 Encounter for gynecological examination (general) (routine) without abnormal findings: Secondary | ICD-10-CM | POA: Diagnosis not present

## 2018-12-09 DIAGNOSIS — N952 Postmenopausal atrophic vaginitis: Secondary | ICD-10-CM

## 2018-12-09 DIAGNOSIS — Z79890 Hormone replacement therapy (postmenopausal): Secondary | ICD-10-CM

## 2018-12-09 DIAGNOSIS — M8580 Other specified disorders of bone density and structure, unspecified site: Secondary | ICD-10-CM | POA: Diagnosis not present

## 2018-12-09 MED ORDER — ESTRADIOL 0.05 MG/24HR TD PTTW: MEDICATED_PATCH | TRANSDERMAL | 4 refills | Status: DC

## 2018-12-09 NOTE — Progress Notes (Signed)
    Sharyn LullFreda Yanes 04-May-1951 [id number]        68 y.o.  [id number] for annual gynecologic exam.  Without gynecologic complaints  Past medical history,surgical history, problem list, medications, allergies, family history and social history were all reviewed and documented as reviewed in the EPIC chart.  ROS:  Performed with pertinent positives and negatives included in the history, assessment and plan.   Additional significant findings : None   Exam: Kennon PortelaKim Gardner assistant Vitals:   12/09/18 0844  BP: 118/76  Weight: 124 lb (56.2 kg)  Height: 5' (1.524 m)   Body mass index is 24.22 kg/m.  General appearance:  Normal affect, orientation and appearance. Skin: Grossly normal HEENT: Without gross lesions.  No cervical or supraclavicular adenopathy. Thyroid normal.  Lungs:  Clear without wheezing, rales or rhonchi Cardiac: RR, without RMG Abdominal:  Soft, nontender, without masses, guarding, rebound, organomegaly or hernia Breasts:  Examined lying and sitting without masses, retractions, discharge or axillary adenopathy. Pelvic:  Ext, BUS, Vagina: With atrophic changes  Adnexa: Without masses or tenderness    Anus and perineum: Normal   Rectovaginal: Normal sphincter tone without palpated masses or tenderness.    Assessment/Plan:  68 y.o. [id number] female for annual gynecologic exam.  Status post TAH LSO and subsequent vaginal prolapse surgery by Dr. Terrilee FilesMacDermid.  1. Postmenopausal/HRT.  Continues on Vivelle 0.05 mg patches.  Had tried stopping with unacceptable hot flushes and sweats.  We again discussed the risks versus benefits.  She does have a history of CABG and a realistic risk discussion about thrombosis was reviewed and accepted.  Breast cancer issues also discussed.  Patient feels it is a quality of life issue and wants to continue.  Refill x1 year provided. 2. Osteopenia.  DEXA 12/2017 T score -2.0 FRAX 12% / 2.6%.  Plan repeat DEXA next year at 2-year interval. 3. Mammography  this month.  Breast exam normal today. 4. History of VAIN 2002.  Pap smear 2018.  No Pap smear done today.  We will plan follow-up Pap smear next year at 3-year interval. 5. Colonoscopy 2019.  Will repeat at their recommended interval. 6. Health maintenance.  No routine lab work done as patient does this elsewhere.  Follow-up 1 year, sooner as needed.   Dara Lordsimothy P Irvan Tiedt MD, 9:05 AM 12/09/2018

## 2018-12-09 NOTE — Patient Instructions (Signed)
Follow-up in 1 year for annual exam, sooner if any issues. 

## 2019-08-18 ENCOUNTER — Encounter: Payer: Self-pay | Admitting: Gynecology

## 2019-12-15 ENCOUNTER — Other Ambulatory Visit: Payer: Self-pay

## 2019-12-16 ENCOUNTER — Encounter: Payer: Self-pay | Admitting: Obstetrics and Gynecology

## 2019-12-16 ENCOUNTER — Ambulatory Visit: Payer: 59 | Admitting: Obstetrics and Gynecology

## 2019-12-16 VITALS — BP 118/76 | Ht 60.0 in | Wt 128.0 lb

## 2019-12-16 DIAGNOSIS — Z01419 Encounter for gynecological examination (general) (routine) without abnormal findings: Secondary | ICD-10-CM | POA: Diagnosis not present

## 2019-12-16 DIAGNOSIS — Z79890 Hormone replacement therapy (postmenopausal): Secondary | ICD-10-CM | POA: Diagnosis not present

## 2019-12-16 DIAGNOSIS — M8580 Other specified disorders of bone density and structure, unspecified site: Secondary | ICD-10-CM | POA: Diagnosis not present

## 2019-12-16 DIAGNOSIS — Z124 Encounter for screening for malignant neoplasm of cervix: Secondary | ICD-10-CM

## 2019-12-16 MED ORDER — ESTRADIOL 0.025 MG/24HR TD PTTW: 1.0000 | MEDICATED_PATCH | TRANSDERMAL | 12 refills | Status: DC

## 2019-12-16 NOTE — Progress Notes (Signed)
Cindy Blankenship 02-27-1951 [id number]  SUBJECTIVE:  69 y.o. [id number] female for annual routine gynecologic exam and Pap smear. She has no gynecologic concerns.   Current Outpatient Medications  Medication Sig Dispense Refill  . aspirin EC 81 MG tablet Take 81 mg by mouth daily.    Marland Kitchen atenolol (TENORMIN) 25 MG tablet Take by mouth daily.    . Calcium 500-100 MG-UNIT CHEW Chew by mouth. Chew 3 pills daily    . cetirizine (ZYRTEC) 10 MG tablet Take 10 mg by mouth daily.     . clobetasol (TEMOVATE) 0.05 % external solution Apply 1 application topically 2 (two) times daily as needed.    Marland Kitchen estradiol (VIVELLE-DOT) 0.05 MG/24HR patch APPLY 1 PATCH TWO TIMES A WEEK AS DIRECTED 24 patch 4  . hyoscyamine (LEVBID) 0.375 MG 12 hr tablet take 1 tablet by mouth every 12 hours if needed for cramping 60 tablet 0  . levothyroxine (SYNTHROID, LEVOTHROID) 75 MCG tablet take 1 tablet by mouth every morning BEFORE BREAKFAST 30 tablet 6  . Multiple Vitamin (MULTIVITAMIN) tablet Take 1 tablet by mouth daily.    . simvastatin (ZOCOR) 20 MG tablet take 1 tablet by mouth at bedtime 30 tablet 3  . Tetrahydrozoline HCl (VISINE OP) Apply 1 drop to eye every morning.     Current Facility-Administered Medications  Medication Dose Route Frequency Provider Last Rate Last Admin  . 0.9 %  sodium chloride infusion  500 mL Intravenous Once Armbruster, Willaim Rayas, MD       Allergies: Penicillins, Sulfonamide derivatives, Atorvastatin, Tape, and Tetracycline  Patient's last menstrual period was 11/11/1984.  Past medical history,surgical history, problem list, medications, allergies, family history and social history were all reviewed and documented as reviewed in the EPIC chart.  ROS:  Feeling well. No dyspnea or chest pain on exertion.  No abdominal pain, change in bowel habits, black or bloody stools.  No urinary tract symptoms. GYN ROS  no abnormal bleeding, pelvic pain or discharge, no breast pain or new or enlarging lumps  on self exam. No neurological complaints.    OBJECTIVE:  BP 118/76   Ht 5' (1.524 m)   Wt 128 lb (58.1 kg)   LMP 11/11/1984   BMI 25.00 kg/m  The patient appears well, alert, oriented x 3, in no distress. ENT normal.  Neck supple. No cervical or supraclavicular adenopathy or thyromegaly.  Lungs are clear, good air entry, no wheezes, rhonchi or rales. S1 and S2 normal, no murmurs, regular rate and rhythm.  Abdomen soft without tenderness, guarding, mass or organomegaly.  Neurological is normal, no focal findings.  BREAST EXAM: breasts appear normal, no suspicious masses, no skin or nipple changes or axillary nodes  PELVIC EXAM: VULVA: normal appearing vulva with no masses, tenderness or lesions, VAGINA: normal appearing vagina with normal color and discharge, no lesions, CERVIX: surgically absent, UTERUS: surgically absent, vaginal cuff well healed, ADNEXA: no masses  Chaperone: Kennon Portela present during the examination  ASSESSMENT:  69 y.o. [id number] here for annual gynecologic exam  PLAN:   1. Postmenopausal HRT. Prior TAH and LSO with vaginal prolapse surgery.  She has continued Vivelle 0.05 mg patches, with a history of CABG and risk factors for thrombotic disease, we recommend trying to decrease the dose to the lowest effective dose possible for her.  I provided her with a prescription for Vivelle 0.025 mg patches and she will let us know if she is not doing well with that change.  Risks for use of  estrogen I reviewed again and she does want to continue.  Refill for 1 year is provided at the decreased dose.2.  2. History of VAIN in 2002.  Pap smear 11/2016. Pap smear is repeated today.  3. Mammogram 11/2019. Will continue with annual mammography. Breast exam normal today. 4. Colonoscopy 2019. Recommended that she continue per the prescribed interval.  5. Osteopenia. DEXA 12/2017 with T score -2.0 FRAX 12% / 2.6%. Plan to repeat DEXA in upcoming months. Continue reduced dose calcium  supplementation and vitamin D supplement. 6. Health maintenance.  No lab work as she has this completed with her primary care provider.     Return annually or sooner, prn.  Joseph Pierini MD  12/16/19

## 2019-12-16 NOTE — Patient Instructions (Addendum)
Please remember to schedule your bone density scan this year We will have you try the lower dose of the estrogen patch, if you are having too many symptoms then please let us know

## 2019-12-17 LAB — PAP IG W/ RFLX HPV ASCU

## 2019-12-17 NOTE — Addendum Note (Signed)
Addended by: Dayna Barker on: 12/17/2019 11:04 AM   Modules accepted: Orders

## 2019-12-22 ENCOUNTER — Other Ambulatory Visit: Payer: Self-pay | Admitting: *Deleted

## 2019-12-22 DIAGNOSIS — M8589 Other specified disorders of bone density and structure, multiple sites: Secondary | ICD-10-CM

## 2020-01-03 ENCOUNTER — Other Ambulatory Visit: Payer: Self-pay

## 2020-01-04 ENCOUNTER — Ambulatory Visit (INDEPENDENT_AMBULATORY_CARE_PROVIDER_SITE_OTHER): Payer: 59

## 2020-01-04 ENCOUNTER — Other Ambulatory Visit: Payer: Self-pay | Admitting: Obstetrics and Gynecology

## 2020-01-04 DIAGNOSIS — Z780 Asymptomatic menopausal state: Secondary | ICD-10-CM | POA: Diagnosis not present

## 2020-01-04 DIAGNOSIS — M85852 Other specified disorders of bone density and structure, left thigh: Secondary | ICD-10-CM

## 2020-01-04 DIAGNOSIS — M8589 Other specified disorders of bone density and structure, multiple sites: Secondary | ICD-10-CM | POA: Diagnosis not present

## 2020-01-04 DIAGNOSIS — M85851 Other specified disorders of bone density and structure, right thigh: Secondary | ICD-10-CM

## 2020-01-04 DIAGNOSIS — M8580 Other specified disorders of bone density and structure, unspecified site: Secondary | ICD-10-CM

## 2020-10-30 ENCOUNTER — Other Ambulatory Visit: Payer: Self-pay | Admitting: *Deleted

## 2020-10-30 MED ORDER — ESTRADIOL 0.025 MG/24HR TD PTTW: 1.0000 | MEDICATED_PATCH | TRANSDERMAL | 0 refills | Status: DC

## 2020-12-18 ENCOUNTER — Encounter: Payer: 59 | Admitting: Obstetrics and Gynecology

## 2020-12-19 ENCOUNTER — Ambulatory Visit: Payer: 59 | Admitting: Obstetrics and Gynecology

## 2020-12-19 ENCOUNTER — Other Ambulatory Visit: Payer: Self-pay

## 2020-12-19 ENCOUNTER — Encounter: Payer: Self-pay | Admitting: Obstetrics and Gynecology

## 2020-12-19 VITALS — BP 138/76 | HR 60 | Resp 16 | Ht 59.5 in | Wt 122.4 lb

## 2020-12-19 DIAGNOSIS — Z01419 Encounter for gynecological examination (general) (routine) without abnormal findings: Secondary | ICD-10-CM | POA: Diagnosis not present

## 2020-12-19 DIAGNOSIS — M8580 Other specified disorders of bone density and structure, unspecified site: Secondary | ICD-10-CM

## 2020-12-19 DIAGNOSIS — Z79890 Hormone replacement therapy (postmenopausal): Secondary | ICD-10-CM | POA: Diagnosis not present

## 2020-12-19 MED ORDER — ESTRADIOL 0.025 MG/24HR TD PTTW: 1.0000 | MEDICATED_PATCH | TRANSDERMAL | 3 refills | Status: DC

## 2020-12-19 NOTE — Progress Notes (Signed)
Cindy Blankenship 11/08/1951 [id number]  SUBJECTIVE:  70 y.o. [id number] female for annual routine gynecologic exam and Pap smear. She has no gynecologic concerns.   Current Outpatient Medications  Medication Sig Dispense Refill  . aspirin EC 81 MG tablet Take 81 mg by mouth daily.    Marland Kitchen atenolol (TENORMIN) 25 MG tablet Take by mouth daily.    . Calcium 500-100 MG-UNIT CHEW Chew by mouth. Chew 3 pills daily    . cetirizine (ZYRTEC) 10 MG tablet Take 10 mg by mouth daily.    Marland Kitchen estradiol (VIVELLE-DOT) 0.025 MG/24HR Place 1 patch onto the skin 2 (two) times a week. 24 patch 0  . hyoscyamine (LEVBID) 0.375 MG 12 hr tablet take 1 tablet by mouth every 12 hours if needed for cramping 60 tablet 0  . levothyroxine (SYNTHROID, LEVOTHROID) 75 MCG tablet take 1 tablet by mouth every morning BEFORE BREAKFAST 30 tablet 6  . Multiple Vitamin (MULTIVITAMIN) tablet Take 1 tablet by mouth daily.    . simvastatin (ZOCOR) 20 MG tablet take 1 tablet by mouth at bedtime 30 tablet 3  . Tetrahydrozoline HCl (VISINE OP) Apply 1 drop to eye every morning.     Current Facility-Administered Medications  Medication Dose Route Frequency Provider Last Rate Last Admin  . 0.9 %  sodium chloride infusion  500 mL Intravenous Once Armbruster, Willaim Rayas, MD       Allergies: Penicillins, Sulfonamide derivatives, Atorvastatin, Tape, and Tetracycline  Patient's last menstrual period was 11/11/1984.  Past medical history,surgical history, problem list, medications, allergies, family history and social history were all reviewed and documented as reviewed in the EPIC chart.  ROS: Pertinent positives and negatives as reviewed in HPI   OBJECTIVE:  BP 138/76   Pulse 60   Resp 16   Ht 4' 11.5" (1.511 m)   Wt 122 lb 6.4 oz (55.5 kg)   LMP 11/11/1984   BMI 24.31 kg/m  The patient appears well, alert, oriented, in no distress.  BREAST EXAM: breasts appear normal, no suspicious masses, no skin or nipple changes or axillary  nodes  PELVIC EXAM: VULVA: normal appearing vulva with atrophic change, no masses, tenderness or lesions, VAGINA: normal appearing vagina with atrophic change, normal color and discharge, no lesions, CERVIX: surgically absent, UTERUS: surgically absent, vaginal cuff well healed, ADNEXA: no masses  Chaperone: Kim RN present during the examination  ASSESSMENT:  70 y.o. [id number] here for annual gynecologic exam  PLAN:   1. Postmenopausal HRT. Prior TAH and LSO with vaginal prolapse surgery.  She has continued Vivelle 0.025 mg patches after the dose decrease last year, with a history of CABG and risk factors for thrombotic disease, we have recommend trying to decrease the dose to the lowest effective dose possible for her.  She is doing alright with the current dose but still having some night sweats which she notes increased a little with the dose decrease last year.  Understanding the risks of thrombotic diseases to include heart attack, stroke, DVT, PE and the breast cancer issue, she feels that the benefits of symptom reduction outweigh the risks at this point.  I provided her with refill for Vivelle 0.025 mg patches.  May also try some OTC supplements including soy isoflavones.  Will follow-up if any significantly worsening symptoms. 2. History of VAIN in 2002.  Pap smear 12/2019 was normal. Pap smear is not repeated today.  3. Mammogram 11/2020. Will continue with annual mammography. Breast exam normal today. 4. Colonoscopy 2019. Recommended that she  continue per the prescribed interval.  5. Osteopenia. DEXA 12/2019 with T score -1.9 FRAX 13% / 2.6%. Plan to repeat DEXA in at 2-year interval. Continue reduced dose calcium supplementation and vitamin D supplement. 6. Health maintenance.  No lab work as she has this completed with her primary care provider.  History of hypertension by her primary.  Blood pressure mildly elevated today so she should keep an eye on it and notify her primary if any  persistent elevations.  The patient is aware that I will only be at this practice until early March 2022 so she knows to make sure she requests follow-up on results for any medical tests that she does when I am no longer at the practice.  Return annually or sooner, prn.  Cindy Majors MD  12/19/20

## 2021-07-12 HISTORY — PX: ELBOW SURGERY: SHX618

## 2021-12-04 ENCOUNTER — Other Ambulatory Visit: Payer: Self-pay

## 2021-12-04 NOTE — Telephone Encounter (Signed)
Last AEX 12/19/20 with Dr. Penni Bombard. Scheduled 12/24/21 with Dr. Seymour Bars.

## 2021-12-06 ENCOUNTER — Encounter: Payer: Self-pay | Admitting: Obstetrics & Gynecology

## 2021-12-07 ENCOUNTER — Encounter: Payer: Self-pay | Admitting: Obstetrics & Gynecology

## 2021-12-07 MED ORDER — ESTRADIOL 0.025 MG/24HR TD PTTW: 1.0000 | MEDICATED_PATCH | TRANSDERMAL | 0 refills | Status: DC

## 2021-12-20 ENCOUNTER — Ambulatory Visit: Payer: 59 | Admitting: Obstetrics & Gynecology

## 2021-12-24 ENCOUNTER — Encounter: Payer: Self-pay | Admitting: Obstetrics & Gynecology

## 2021-12-24 ENCOUNTER — Ambulatory Visit (INDEPENDENT_AMBULATORY_CARE_PROVIDER_SITE_OTHER): Payer: 59 | Admitting: Obstetrics & Gynecology

## 2021-12-24 ENCOUNTER — Other Ambulatory Visit: Payer: Self-pay

## 2021-12-24 VITALS — BP 136/78 | HR 72 | Ht 60.25 in | Wt 128.0 lb

## 2021-12-24 DIAGNOSIS — Z01419 Encounter for gynecological examination (general) (routine) without abnormal findings: Secondary | ICD-10-CM

## 2021-12-24 DIAGNOSIS — Z90710 Acquired absence of both cervix and uterus: Secondary | ICD-10-CM

## 2021-12-24 DIAGNOSIS — M8580 Other specified disorders of bone density and structure, unspecified site: Secondary | ICD-10-CM

## 2021-12-24 DIAGNOSIS — Z79890 Hormone replacement therapy (postmenopausal): Secondary | ICD-10-CM

## 2021-12-24 DIAGNOSIS — N893 Dysplasia of vagina, unspecified: Secondary | ICD-10-CM | POA: Diagnosis not present

## 2021-12-24 MED ORDER — ESTRADIOL 0.025 MG/24HR TD PTTW
1.0000 | MEDICATED_PATCH | TRANSDERMAL | 4 refills | Status: DC
Start: 2021-12-24 — End: 2022-12-24

## 2021-12-24 NOTE — Progress Notes (Signed)
Esm… Flammer 1951-08-08 [id number]   History:    71 y.o. G3P2A1L2  RP:  Established patient presenting for annual gyn exam   HPI:  Postmenopausal HRT. Prior TAH and LSO with vaginal prolapse surgery.  History of VAIN in 2002.  No pelvic pain.  Pap smear 12/2019 was normal.  Breasts normal.  Mammogram 11/2020. Mild SUI.  Will start [place].  BMs normal.  Colonoscopy 2019.  Osteopenia. DEXA 12/2019 with T score -1.9 FRAX 13% / 2.6%.  Schedule BD.  Continue reduced dose calcium supplementation and vitamin D supplement.  BMI 24.79.  Health labs with Fam MD.  Past medical history,surgical history, family history and social history were all reviewed and documented in the EPIC chart.  Gynecologic History Patient's last menstrual period was 11/11/1984.  Obstetric History OB History  Gravida Para Term Preterm AB Living  3 2     1 2   SAB IAB Ectopic Multiple Live Births               # Outcome Date GA Lbr Len/2nd Weight Sex Delivery Anes PTL Lv  3 AB           2 Para           1 Para              ROS: A ROS was performed and pertinent positives and negatives are included in the history.  GENERAL: No fevers or chills. HEENT: No change in vision, no earache, sore throat or sinus congestion. NECK: No pain or stiffness. CARDIOVASCULAR: No chest pain or pressure. No palpitations. PULMONARY: No shortness of breath, cough or wheeze. GASTROINTESTINAL: No abdominal pain, nausea, vomiting or diarrhea, melena or bright red blood per rectum. GENITOURINARY: No urinary frequency, urgency, hesitancy or dysuria. MUSCULOSKELETAL: No joint or muscle pain, no back pain, no recent trauma. DERMATOLOGIC: No rash, no itching, no lesions. ENDOCRINE: No polyuria, polydipsia, no heat or cold intolerance. No recent change in weight. HEMATOLOGICAL: No anemia or easy bruising or bleeding. NEUROLOGIC: No headache, seizures, numbness, tingling or weakness. PSYCHIATRIC: No depression, no loss of interest in normal activity or  change in sleep pattern.     Exam:   BP 136/78    Pulse 72    Ht 5' 0.25" (1.53 m)    Wt 128 lb (58.1 kg)    LMP 11/11/1984    SpO2 97%    BMI 24.79 kg/m   Body mass index is 24.79 kg/m.  General appearance : Well developed well nourished female. No acute distress HEENT: Eyes: no retinal hemorrhage or exudates,  Neck supple, trachea midline, no carotid bruits, no thyroidmegaly Lungs: Clear to auscultation, no rhonchi or wheezes, or rib retractions  Heart: Regular rate and rhythm, no murmurs or gallops Breast:Examined in sitting and supine position were symmetrical in appearance, no palpable masses or tenderness,  no skin retraction, no nipple inversion, no nipple discharge, no skin discoloration, no axillary or supraclavicular lymphadenopathy Abdomen: no palpable masses or tenderness, no rebound or guarding Extremities: no edema or skin discoloration or tenderness  Pelvic: Vulva: Normal             Vagina: No gross lesions or discharge  Cervix/Uterus absent  Adnexa  Without masses or tenderness  Anus: Normal   Assessment/Plan:  71 y.o. female for annual exam   1. Encounter for gynecological examination without abnormal finding Postmenopausal HRT. Prior TAH and LSO with vaginal prolapse surgery.  History of VAIN in 2002.  No pelvic  pain.  Pap smear 12/2019 was normal.  Breasts normal.  Mammogram 11/2020. Mild SUI.  Will start [place].  BMs normal.  Colonoscopy 2019.  Osteopenia. DEXA 12/2019 with T score -1.9 FRAX 13% / 2.6%.  Schedule BD.  Continue reduced dose calcium supplementation and vitamin D supplement.  BMI 24.79.  Health labs with Fam MD.  Followed by Cardio, had Coronary Artery Bypass x 4.  2. VAIN (vaginal intraepithelial neoplasia) VAIN in 2002.  Paps normal since then.  3. S/P total hysterectomy/LSO/Bladder tack  4. Hormone replacement therapy (HRT) Well on Estradiol patch 0.025 twice a week.  Significant hot flushes when stops HRT.  Risks known to patient.  Will send  prescription to pharmacy.  Recommend weaning during the course of the year.  5. Osteopenia, unspecified location  Osteopenia. DEXA 12/2019 with T score -1.9 FRAX 13% / 2.6%.  Continue reduced dose calcium supplementation and vitamin D supplement. Will schedule BD here now.   - DG Bone Density; Future  Other orders - aspirin 81 MG chewable tablet; Chew by mouth. - levothyroxine (SYNTHROID) 50 MCG tablet; Take by mouth. - cyclobenzaprine (FLEXERIL) 5 MG tablet; Take 5 mg by mouth 3 (three) times daily as needed. - simvastatin (ZOCOR) 40 MG tablet; Take 40 mg by mouth at bedtime. - zolpidem (AMBIEN) 5 MG tablet; Take 5 mg by mouth at bedtime as needed. - estradiol (VIVELLE-DOT) 0.025 MG/24HR; Place 1 patch onto the skin 2 (two) times a week. Recommend weaning over the course of the year.   Princess Bruins MD, 2:33 PM 12/24/2021

## 2022-02-11 ENCOUNTER — Ambulatory Visit (INDEPENDENT_AMBULATORY_CARE_PROVIDER_SITE_OTHER): Payer: Medicare Other

## 2022-02-11 ENCOUNTER — Other Ambulatory Visit: Payer: Self-pay | Admitting: Obstetrics & Gynecology

## 2022-02-11 DIAGNOSIS — M85851 Other specified disorders of bone density and structure, right thigh: Secondary | ICD-10-CM

## 2022-02-11 DIAGNOSIS — M85852 Other specified disorders of bone density and structure, left thigh: Secondary | ICD-10-CM | POA: Diagnosis not present

## 2022-02-11 DIAGNOSIS — Z780 Asymptomatic menopausal state: Secondary | ICD-10-CM | POA: Diagnosis not present

## 2022-02-11 DIAGNOSIS — M8580 Other specified disorders of bone density and structure, unspecified site: Secondary | ICD-10-CM

## 2022-11-26 ENCOUNTER — Encounter: Payer: Self-pay | Admitting: Gastroenterology

## 2022-12-20 ENCOUNTER — Encounter: Payer: Self-pay | Admitting: Obstetrics & Gynecology

## 2022-12-24 ENCOUNTER — Encounter: Payer: Self-pay | Admitting: Obstetrics & Gynecology

## 2022-12-24 ENCOUNTER — Ambulatory Visit: Payer: Medicare Other | Admitting: Obstetrics & Gynecology

## 2022-12-24 ENCOUNTER — Other Ambulatory Visit: Payer: Self-pay | Admitting: Obstetrics & Gynecology

## 2022-12-24 VITALS — BP 124/76 | HR 62 | Ht 59.25 in | Wt 121.0 lb

## 2022-12-24 DIAGNOSIS — Z01419 Encounter for gynecological examination (general) (routine) without abnormal findings: Secondary | ICD-10-CM

## 2022-12-24 DIAGNOSIS — Z90710 Acquired absence of both cervix and uterus: Secondary | ICD-10-CM

## 2022-12-24 DIAGNOSIS — Z79890 Hormone replacement therapy (postmenopausal): Secondary | ICD-10-CM | POA: Diagnosis not present

## 2022-12-24 DIAGNOSIS — M8580 Other specified disorders of bone density and structure, unspecified site: Secondary | ICD-10-CM

## 2022-12-24 MED ORDER — ESTRADIOL 0.025 MG/24HR TD PTTW: 1.0000 | MEDICATED_PATCH | TRANSDERMAL | 4 refills | Status: DC

## 2022-12-24 NOTE — Progress Notes (Signed)
Cindy Blankenship 1950/12/21 [id number]   History:    72 y.o.  [id number]   RP:  Established patient presenting for annual gyn exam    HPI:  Postmenopausal HRTwith the Estradiol patch 0.025 twice weekly.  Vasomotor Sxs very intense whenever she attempts weaning. Prior TAH and LSO with vaginal prolapse surgery.  History of VAIN in 2002.  No pelvic pain.  Pap smear 12/2019 was normal.  Will repeat at 5 years.  Breasts normal.  Mammogram 12/2022, will obtain report. Mild SUI, [place].  BMs normal. Colonoscopy 11/2017.  Osteopenia -1.9 Rt Fem Neck on DEXA 02/2022 with FRAX 13% / 2.6%. Continue reduced dose calcium supplementation and vitamin D supplement.  BMI 24.23.  Health labs with Fam MD. Flu vaccine done at pharmacy.   Past medical history,surgical history, family history and social history were all reviewed and documented in the EPIC chart.  Gynecologic History Patient's last menstrual period was 11/11/1984.  Obstetric History OB History  Gravida Para Term Preterm AB Living  3 2 2   1 2  $ SAB IAB Ectopic Multiple Live Births               # Outcome Date GA Lbr Len/2nd Weight Sex Delivery Anes PTL Lv  3 AB           2 Term           1 Term              ROS: A ROS was performed and pertinent positives and negatives are included in the history. GENERAL: No fevers or chills. HEENT: No change in vision, no earache, sore throat or sinus congestion. NECK: No pain or stiffness. CARDIOVASCULAR: No chest pain or pressure. No palpitations. PULMONARY: No shortness of breath, cough or wheeze. GASTROINTESTINAL: No abdominal pain, nausea, vomiting or diarrhea, melena or bright red blood per rectum. GENITOURINARY: No urinary frequency, urgency, hesitancy or dysuria. MUSCULOSKELETAL: No joint or muscle pain, no back pain, no recent trauma. DERMATOLOGIC: No rash, no itching, no lesions. ENDOCRINE: No polyuria, polydipsia, no heat or cold intolerance. No recent change in weight. HEMATOLOGICAL: No anemia or easy  bruising or bleeding. NEUROLOGIC: No headache, seizures, numbness, tingling or weakness. PSYCHIATRIC: No depression, no loss of interest in normal activity or change in sleep pattern.     Exam:  BP 124/76   Pulse 62   Ht 4' 11.25" (1.505 m)   Wt 121 lb (54.9 kg)   LMP 11/11/1984 Comment: sexually active  SpO2 98%   BMI 24.23 kg/m   Body mass index is 24.23 kg/m.  General appearance : Well developed well nourished female. No acute distress HEENT: Eyes: no retinal hemorrhage or exudates,  Neck supple, trachea midline, no carotid bruits, no thyroidmegaly Lungs: Clear to auscultation, no rhonchi or wheezes, or rib retractions  Heart: Regular rate and rhythm, no murmurs or gallops Breast:Examined in sitting and supine position were symmetrical in appearance, no palpable masses or tenderness,  no skin retraction, no nipple inversion, no nipple discharge, no skin discoloration, no axillary or supraclavicular lymphadenopathy Abdomen: no palpable masses or tenderness, no rebound or guarding Extremities: no edema or skin discoloration or tenderness  Pelvic: Vulva: Normal             Vagina: No gross lesions or discharge  Cervix/Uterus absent  Adnexa  Without masses or tenderness  Anus: Normal   Assessment/Plan:  72 y.o. female for annual exam   1. Encounter for gynecological examination without abnormal finding  Postmenopausal HRT with the Estradiol patch 0.025 twice weekly.  Vasomotor Sxs very intense whenever she attempts weaning. Prior TAH and LSO with vaginal prolapse surgery.  History of VAIN in 2002.  No pelvic pain.  Pap smear 12/2019 was normal.  Will repeat at 5 years.  Breasts normal.  Mammogram 12/2022, will obtain report. Mild SUI, Kegels.  BMs normal. Colonoscopy 11/2017.  Osteopenia -1.9 Rt Fem Neck on DEXA 02/2022 with FRAX 13% / 2.6%. Continue reduced dose calcium supplementation and vitamin D supplement.  BMI 24.23.  Health labs with Fam MD. Flu vaccine done at pharmacy.   2.  S/P total hysterectomy/LSO/Bladder tack  3. Postmenopausal hormone replacement therapy Postmenopausal HRT with the Estradiol patch 0.025 twice weekly.  Vasomotor Sxs very intense whenever she attempts weaning. Prior TAH and LSO with vaginal prolapse surgery.  Aware of risks of Breast Ca and Stroke.  Given the severity of her Vasomotor Sxs when she weans, requests continuing on HRT.  Will attempt cutting the patch and applying 1/2 a patch twice weekly in the course of the year.  4. Osteopenia, unspecified location  Osteopenia -1.9 Rt Fem Neck on DEXA 02/2022 with FRAX 13% / 2.6%. Continue reduced dose calcium supplementation and vitamin D supplement.  Other orders - estradiol (VIVELLE-DOT) 0.025 MG/24HR; Place 1 patch onto the skin 2 (two) times a week. Recommend weaning over the course of the year.   Princess Bruins MD, 2:47 PM

## 2022-12-25 ENCOUNTER — Other Ambulatory Visit: Payer: Self-pay

## 2022-12-25 DIAGNOSIS — Z79890 Hormone replacement therapy (postmenopausal): Secondary | ICD-10-CM

## 2022-12-25 MED ORDER — ESTRADIOL 0.025 MG/24HR TD PTTW: 1.0000 | MEDICATED_PATCH | TRANSDERMAL | 3 refills | Status: DC

## 2022-12-25 NOTE — Telephone Encounter (Signed)
FYI. Pt calling today to report the need for the script for her estrogen patches be sent in again due to the script being "kicked out" of pharmacy's system when they were trying to use [facility] versus insurance since OOP was too high.   Rx re-sent. Pt notified. Will send to provider for final review.

## 2022-12-26 ENCOUNTER — Telehealth: Payer: Self-pay

## 2022-12-26 NOTE — Telephone Encounter (Signed)
Prior authorization needed for estradiol 0.012m patch twice weekly. Prior authorization sent to plan. [id number] aware & states she did get 361m supply using good rx. I told patient I will let her know when I get a response from her insurance company

## 2022-12-27 NOTE — Telephone Encounter (Signed)
Prior authorization approved & patient is aware.

## 2023-03-02 NOTE — Progress Notes (Signed)
New Patient Note  RE: Cindy Blankenship MRN: [id number] DOB: 05/31/1951 Date of Office Visit: 03/03/2023  Consult requested by: Stevphen Rochester, MD Primary care provider: Stevphen Rochester, MD  Chief Complaint: No chief complaint on file.  History of Present Illness: I had the pleasure of seeing Cindy Blankenship for initial evaluation at the [facility] on 03/02/2023. She is a 72 y.o. female, who is referred here by Stevphen Rochester, MD for the evaluation of ***.  [employer]  ***  Assessment and Plan: Cindy Blankenship is a 72 y.o. female with: No problem-specific Assessment & Plan notes found for this encounter.  No follow-ups on file.  No orders of the defined types were placed in this encounter.  Lab Orders  No laboratory test(s) ordered today    Other allergy screening: Asthma: {Blank single:19197::"yes","no"} Rhino conjunctivitis: {Blank single:19197::"yes","no"} Food allergy: {Blank single:19197::"yes","no"} Medication allergy: {Blank single:19197::"yes","no"} Hymenoptera allergy: {Blank single:19197::"yes","no"} Urticaria: {Blank single:19197::"yes","no"} Eczema:{Blank single:19197::"yes","no"} History of recurrent infections suggestive of immunodeficency: {Blank single:19197::"yes","no"}  Diagnostics: Spirometry:  Tracings reviewed. Her effort: {Blank single:19197::"Good reproducible efforts.","It was hard to get consistent efforts and there is a question as to whether this reflects a maximal maneuver.","Poor effort, data can not be interpreted."} FVC: ***L FEV1: ***L, ***% predicted FEV1/FVC ratio: ***% Interpretation: {Blank single:19197::"Spirometry consistent with mild obstructive disease","Spirometry consistent with moderate obstructive disease","Spirometry consistent with severe obstructive disease","Spirometry consistent with possible restrictive disease","Spirometry consistent with mixed obstructive and restrictive disease","Spirometry uninterpretable  due to technique","Spirometry consistent with normal pattern","No overt abnormalities noted given today's efforts"}.  Please see scanned spirometry results for details.  Skin Testing: {Blank single:19197::"Select foods","Environmental allergy panel","Environmental allergy panel and select foods","Food allergy panel","None","Deferred due to recent antihistamines use"}. *** Results discussed with patient/family.   Past Medical History: Patient Active Problem List   Diagnosis Date Noted   Routine general medical examination at a health care facility 07/14/2014   Maxillary sinusitis 02/04/2014   Cystocele    Rectocele    Abdominal pain, epigastric 08/11/2013   Pleuritic pain 07/22/2012   VAIN (vaginal intraepithelial neoplasia)    Osteopenia    HAND PAIN, RIGHT 03/14/2010   INSOMNIA 10/31/2009   SORE THROAT 06/19/2009   DYSPEPSIA 12/07/2008   CONSTIPATION 12/07/2008   IRRITABLE BOWEL SYNDROME 12/01/2008   HYPOTHYROIDISM 06/09/2008   HYPERLIPIDEMIA 06/09/2008   BACK PAIN 05/01/2007   HYPERTENSION 01/04/2007   HEMORRHOIDS NOS W/COMPLICATION NEC 01/04/2007   ALLERGIC RHINITIS 01/04/2007   Past Medical History:  Diagnosis Date   Allergic rhinitis    Back pain    Coronary artery disease 2017   had CABG   Hemorrhoids with complication    Hyperlipidemia    Hypertension    Hypothyroidism    IBS (irritable bowel syndrome)    Osteopenia 12/2017   T score -2.0 FRAX 12% / 2.6%   Pneumonia    VAIN (vaginal intraepithelial neoplasia) 2002   Past Surgical History: Past Surgical History:  Procedure Laterality Date   ABDOMINAL HYSTERECTOMY     ANTERIOR AND POSTERIOR REPAIR N/A 03/07/2015   Procedure: CYSTOSCOPE, CYSTOCELE  AND  ;  Surgeon: Alfredo Martinez, MD;  Location: [facility];  Service: Urology;  Laterality: N/A;   CORONARY ARTERY BYPASS GRAFT  09/09/2016   At [facility]   ELBOW SURGERY Right 07/2021   Torn Tendon   EYE SURGERY     lasik 2006    LAPAROSCOPY ABDOMEN DIAGNOSTIC     LEFT OOPHORECTOMY     PELVIC LAPAROSCOPY  VAGINAL PROLAPSE REPAIR N/A 03/07/2015   Procedure: VAULT PROLAPSE WITH GRAFT ;  Surgeon: Alfredo Martinez, MD;  Location: [facility];  Service: Urology;  Laterality: N/A;   Medication List:  Current Outpatient Medications  Medication Sig Dispense Refill   aspirin EC 81 MG tablet Take 81 mg by mouth daily.     atenolol (TENORMIN) 25 MG tablet Take by mouth daily.     Calcium 500-100 MG-UNIT CHEW Chew by mouth. Chew 2 daily     cetirizine (ZYRTEC) 10 MG tablet Take 10 mg by mouth daily.     cyclobenzaprine (FLEXERIL) 5 MG tablet Take 5 mg by mouth 3 (three) times daily as needed.     estradiol (VIVELLE-DOT) 0.025 MG/24HR Place 1 patch onto the skin 2 (two) times a week. 24 patch 3   hyoscyamine (LEVBID) 0.375 MG 12 hr tablet take 1 tablet by mouth every 12 hours if needed for cramping 60 tablet 0   Multiple Vitamin (MULTIVITAMIN) tablet Take 1 tablet by mouth daily.     simvastatin (ZOCOR) 40 MG tablet Take 40 mg by mouth at bedtime.     Tetrahydrozoline HCl (VISINE OP) Apply 1 drop to eye every morning.     zolpidem (AMBIEN) 5 MG tablet Take 5 mg by mouth at bedtime as needed.     No current facility-administered medications for this visit.   Allergies: Allergies  Allergen Reactions   Penicillins Shortness Of Breath and Swelling    Angioedema   Sulfonamide Derivatives Shortness Of Breath and Swelling    Angioedema   Atorvastatin Other (See Comments)    myalgia   Trazodone And Nefazodone Other (See Comments)    Headache, dizzy   Tape     Causes blisters, and redness   Tetracycline Rash    hives   Social History: Social History   Socioeconomic History   Marital status: Married    Spouse name: Not on file   Number of children: Not on file   Years of education: Not on file   Highest education level: Not on file  Occupational History   Not on file  Tobacco Use   Smoking status: Former    Types:  Cigarettes    Quit date: 02/26/1986    Years since quitting: 37.0   Smokeless tobacco: Never  Vaping Use   Vaping Use: Never used  Substance and Sexual Activity   Alcohol use: Yes    Alcohol/week: 1.0 - 2.0 standard drink of alcohol    Types: 1 - 2 Standard drinks or equivalent per week   Drug use: No   Sexual activity: Yes    Birth control/protection: Surgical    Comment: HYST--1st intercourse 72 yo--Fewer than 5 partners  Other Topics Concern   Not on file  Social History Narrative   Not on file   Social Determinants of Health   Financial Resource Strain: Not on file  Food Insecurity: Not on file  Transportation Needs: Not on file  Physical Activity: Not on file  Stress: Not on file  Social Connections: Not on file   Lives in a ***. Smoking: *** Occupation: ***  Environmental HistorySurveyor, minerals in the house: Copywriter, advertising in the family room: {Blank single:19197::"yes","no"} Carpet in the bedroom: {Blank single:19197::"yes","no"} Heating: {Blank single:19197::"electric","gas","heat pump"} Cooling: {Blank single:19197::"central","window","heat pump"} Pet: {Blank single:19197::"yes ***","no"}  Family History: Family History  Problem Relation Age of Onset   Heart disease Mother    Colon cancer Mother 56   Diabetes Mother  Hypertension Mother    Hyperlipidemia Mother    Colon polyps Sister    Hypertension Father    Hyperlipidemia Father    Heart disease Father    Hypertension Sister        x2   Hypertension Son    Hypertension Sister    Gallbladder disease Sister    Hypertension Sister    Multiple sclerosis Sister    Stomach cancer Neg Hx    Problem                               Relation Asthma                                   *** Eczema                                *** Food allergy                          *** Allergic rhino conjunctivitis     ***  Review of Systems  Constitutional:  Negative for appetite  change, chills, fever and unexpected weight change.  HENT:  Negative for congestion and rhinorrhea.   Eyes:  Negative for itching.  Respiratory:  Negative for cough, chest tightness, shortness of breath and wheezing.   Cardiovascular:  Negative for chest pain.  Gastrointestinal:  Negative for abdominal pain.  Genitourinary:  Negative for difficulty urinating.  Skin:  Negative for rash.  Neurological:  Negative for headaches.    Objective: LMP 11/11/1984 Comment: sexually active There is no height or weight on file to calculate BMI. Physical Exam Vitals and nursing note reviewed.  Constitutional:      Appearance: Normal appearance. She is well-developed.  HENT:     Head: Normocephalic and atraumatic.     Right Ear: Tympanic membrane and external ear normal.     Left Ear: Tympanic membrane and external ear normal.     Nose: Nose normal.     Mouth/Throat:     Mouth: Mucous membranes are moist.     Pharynx: Oropharynx is clear.  Eyes:     Conjunctiva/sclera: Conjunctivae normal.  Cardiovascular:     Rate and Rhythm: Normal rate and regular rhythm.     Heart sounds: Normal heart sounds. No murmur heard.    No friction rub. No gallop.  Pulmonary:     Effort: Pulmonary effort is normal.     Breath sounds: Normal breath sounds. No wheezing, rhonchi or rales.  Musculoskeletal:     Cervical back: Neck supple.  Skin:    General: Skin is warm.     Findings: No rash.  Neurological:     Mental Status: She is alert and oriented to person, place, and time.  Psychiatric:        Behavior: Behavior normal.    The plan was reviewed with the patient/family, and all questions/concerned were addressed.  It was my pleasure to see Cindy Blankenship today and participate in her care. Please feel free to contact me with any questions or concerns.  Sincerely,  Wyline Mood, DO Allergy & Immunology  [facility] office: [phone] [facility] office:  [phone]

## 2023-03-03 ENCOUNTER — Encounter: Payer: Self-pay | Admitting: Allergy

## 2023-03-03 ENCOUNTER — Other Ambulatory Visit: Payer: Self-pay

## 2023-03-03 ENCOUNTER — Ambulatory Visit: Payer: Medicare Other | Admitting: Allergy

## 2023-03-03 VITALS — BP 124/76 | HR 58 | Temp 98.7°F | Resp 16 | Ht 60.63 in | Wt 123.0 lb

## 2023-03-03 DIAGNOSIS — T781XXA Other adverse food reactions, not elsewhere classified, initial encounter: Secondary | ICD-10-CM | POA: Diagnosis not present

## 2023-03-03 DIAGNOSIS — J3089 Other allergic rhinitis: Secondary | ICD-10-CM | POA: Diagnosis not present

## 2023-03-03 DIAGNOSIS — T63481A Toxic effect of venom of other arthropod, accidental (unintentional), initial encounter: Secondary | ICD-10-CM | POA: Diagnosis not present

## 2023-03-03 DIAGNOSIS — T781XXD Other adverse food reactions, not elsewhere classified, subsequent encounter: Secondary | ICD-10-CM | POA: Insufficient documentation

## 2023-03-03 NOTE — Assessment & Plan Note (Addendum)
Perioral discomfort with cashews, almonds and fresh pineapples. No issues with canned pineapples. Episodes of facial/scalp pruritus 10 min after eating. She thinks it's due to tomatoes as she eliminated from her diet and has less episodes. Takes benadryl prn with good benefit. No prior food allergy work up. Unable to skin test today as [facility] won't allow new patient visits and procedures on the same day.  Continue to avoid tree nuts, tomatoes and fresh pineapples. Keep track of episodes and write down what you had eaten/done the days when you have symptoms. Will test for select foods at the next visit (tree nuts, peanuts, tomatoes, pineapples, eggs, sesame). The pineapple issue is most likely due to the enzyme bromelain as she tolerates the canned form.  No antihistamines for 3 days. If you can't come off antihistamines for 3 days then let us know. For mild symptoms you can take over the counter antihistamines such as Benadryl and monitor symptoms closely. If symptoms worsen or if you have severe symptoms including breathing issues, throat closure, significant swelling, whole body hives, severe diarrhea and vomiting, lightheadedness then seek immediate medical care.

## 2023-03-03 NOTE — Assessment & Plan Note (Signed)
Very large localized reactions in the past. No prior work up. No recent stings. Continue to avoid. Will order bloodwork at the next visit (hymenoptera panel + tryptase).

## 2023-03-03 NOTE — Assessment & Plan Note (Signed)
On AIT in the 1980s with good benefit and again in early 2000s with minimal benefit. No recent testing. Takes zyrtec daily still.  Unable to skin test today as [facility] won't allow new patient visits and procedures on the same day.  Plan on skin testing at next visit (1-59) and will make additional recommendations based on results.

## 2023-03-03 NOTE — Patient Instructions (Addendum)
Unable to skin test today as [facility] won't allow new patient visits and procedures on the same day.   Foods/itching Continue to avoid tree nuts, tomatoes and fresh pineapples. Keep track of episodes and write down what you had eaten/done the days when you have symptoms. Will test for select foods at the next visit.  No antihistamines for 3 days. If you can't come off antihistamines for 3 days then let us know. For mild symptoms you can take over the counter antihistamines such as Benadryl and monitor symptoms closely. If symptoms worsen or if you have severe symptoms including breathing issues, throat closure, significant swelling, whole body hives, severe diarrhea and vomiting, lightheadedness then seek immediate medical care.  Environmental allergies Plan on skin testing at next visit.   Bee sting allergy Continue to avoid. Will order bloodwork at the next visit.  Follow up for skin testing.

## 2023-03-13 ENCOUNTER — Encounter: Payer: Self-pay | Admitting: Family Medicine

## 2023-03-13 ENCOUNTER — Ambulatory Visit: Payer: Medicare Other | Admitting: Family Medicine

## 2023-03-13 ENCOUNTER — Other Ambulatory Visit: Payer: Self-pay

## 2023-03-13 VITALS — BP 118/70 | HR 64 | Temp 98.2°F | Resp 16 | Ht 60.0 in | Wt 120.9 lb

## 2023-03-13 DIAGNOSIS — T63481A Toxic effect of venom of other arthropod, accidental (unintentional), initial encounter: Secondary | ICD-10-CM

## 2023-03-13 DIAGNOSIS — T7800XA Anaphylactic reaction due to unspecified food, initial encounter: Secondary | ICD-10-CM

## 2023-03-13 DIAGNOSIS — T781XXA Other adverse food reactions, not elsewhere classified, initial encounter: Secondary | ICD-10-CM | POA: Diagnosis not present

## 2023-03-13 DIAGNOSIS — T63481D Toxic effect of venom of other arthropod, accidental (unintentional), subsequent encounter: Secondary | ICD-10-CM

## 2023-03-13 DIAGNOSIS — T781XXD Other adverse food reactions, not elsewhere classified, subsequent encounter: Secondary | ICD-10-CM

## 2023-03-13 NOTE — Patient Instructions (Signed)
Allergic rhinitis Your skin testing to environmental allergens was negative at today's visit Continue an antihistamine once a day as needed for runny nose or itch He may use Flonase 2 sprays in each nostril once a day as needed for a stuffy nose Consider saline nasal rinses as needed for nasal symptoms. Use this before any medicated nasal sprays for best result  Food allergy Your skin testing for food allergies to tree nuts, tomato, and pineapple was negative at today's visit. A lab order has been placed to help [place] evaluate your food allergies.  We will call you when the results become available.  Insect sting allergy Lab work has been ordered to help [place] evaluate your stinging insect allergy.  We will call you when results become available  Call the clinic if this treatment plan is not working well for you.  Follow up in 3 months or sooner if needed.

## 2023-03-13 NOTE — Progress Notes (Signed)
[address] [place] [address] Dept: [phone]  FOLLOW UP NOTE  Patient ID: Cindy Blankenship, female    DOB: [birth year]  Age: 72 y.o. MRN: [id number] Date of Office Visit: 03/13/2023  Assessment  Chief Complaint: Allergy Testing (Environmental: 1-59)  HPI Cindy Blankenship is a 72 year old female who presents to the clinic for follow-up visit.  She was last seen in this clinic on 03/03/2023 by Dr. Selena Batten as a new patient for evaluation of allergic rhinitis, possible insect sting allergy, and possible food allergy to tree nuts, tomato, and fresh pineapple.  Unfortunately, her insurance does not allow her to have testing on the initial visit so she is returning for follow-up skin testing.  At today's visit, she reports that she is feeling well overall.  She has not had any antihistamine over the last 3 days.  She continues to avoid tomato, fresh pineapple, almond, and cashews and reports that her mouth feels "raw" after eating these foods.  She denies concomitant symptoms including cardiopulmonary, gastrointestinal, or integumentary symptoms after eating these foods.  Her current medications are listed in the chart.  Drug Allergies:  Allergies  Allergen Reactions   Penicillins Shortness Of Breath and Swelling    Angioedema   Sulfonamide Derivatives Shortness Of Breath and Swelling    Angioedema   Atorvastatin Other (See Comments)    myalgia   Trazodone And Nefazodone Other (See Comments)    Headache, dizzy   Tape     Causes blisters, and redness   Tetracycline Rash    hives    Physical Exam: BP 118/70 (BP Location: Right Arm, Patient Position: Sitting, Cuff Size: Normal)   Pulse 64   Temp 98.2 F (36.8 C) (Temporal)   Resp 16   Ht 5' (1.524 m)   Wt 120 lb 14.4 oz (54.8 kg)   LMP 11/11/1984 Comment: sexually active  SpO2 97%   BMI 23.61 kg/m    Physical Exam Vitals reviewed.  Constitutional:      Appearance: Normal appearance.  HENT:     Head: Normocephalic and  atraumatic.     Right Ear: Tympanic membrane normal.     Left Ear: Tympanic membrane normal.     Nose:     Comments: Nares normal.  Pharynx normal.  Ears normal.  Eyes normal.    Mouth/Throat:     Pharynx: Oropharynx is clear.  Eyes:     Conjunctiva/sclera: Conjunctivae normal.  Cardiovascular:     Rate and Rhythm: Normal rate and regular rhythm.     Heart sounds: Normal heart sounds. No murmur heard. Pulmonary:     Effort: Pulmonary effort is normal.     Breath sounds: Normal breath sounds.     Comments: Lungs clear to auscultation Musculoskeletal:        General: Normal range of motion.     Cervical back: Normal range of motion and neck supple.  Skin:    General: Skin is warm and dry.  Neurological:     Mental Status: She is alert and oriented to person, place, and time.  Psychiatric:        Mood and Affect: Mood normal.        Behavior: Behavior normal.        Thought Content: Thought content normal.        Judgment: Judgment normal.     Diagnostics: Percutaneous adult environmental panel was negative with adequate controls.  Patient was not interested in intradermal testing at this time.  Selected foods  were negative with adequate controls.  Assessment and Plan: 1. Other adverse food reactions, not elsewhere classified, subsequent encounter   2. 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 Allergy with anaphylaxis due to food     Patient Instructions  Allergic rhinitis Your skin testing to environmental allergens was negative at today's visit Continue an antihistamine once a day as needed for runny nose or itch He may use Flonase 2 sprays in each nostril once a day as needed for a stuffy nose Consider saline nasal rinses as needed for nasal symptoms. Use this before any medicated nasal sprays for best result  Food allergy Your skin testing for food allergies to tree nuts, tomato, and pineapple was negative at today's visit. A lab order has been placed to help [place]  evaluate your food allergies.  We will call you when the results become available.  Insect sting allergy Lab work has been ordered to help [place] evaluate your stinging insect allergy.  We will call you when results become available  Call the clinic if this treatment plan is not working well for you.  Follow up in 3 months or sooner if needed.  Return in about 3 months (around 06/13/2023), or if symptoms worsen or fail to improve.    Thank you for the opportunity to care for this patient.  Please do not hesitate to contact me with questions.  Thermon Leyland, FNP [facility]

## 2023-03-17 LAB — ALLERGENS(7)
Brazil Nut IgE: <0.1 kU/L
F020-IgE Almond: <0.1 kU/L
F202-IgE Cashew Nut: <0.1 kU/L
Hazelnut (Filbert) IgE: <0.1 kU/L
Peanut IgE: <0.1 kU/L
Pecan Nut IgE: <0.1 kU/L
Walnut IgE: <0.1 kU/L

## 2023-03-17 LAB — ALLERGEN, PINEAPPLE, F210: Pineapple IgE: <0.1 kU/L

## 2023-03-17 LAB — ALLERGEN, TOMATO F25: Allergen Tomato, IgE: 0.11 kU/L — AB

## 2023-03-19 LAB — ALLERGEN HYMENOPTERA PANEL
Bumblebee: <0.1 kU/L
Honeybee IgE: <0.1 kU/L
Hornet, White Face, IgE: <0.1 kU/L
Hornet, Yellow, IgE: <0.1 kU/L
Paper Wasp IgE: <0.1 kU/L
Yellow Jacket, IgE: 0.22 kU/L — AB

## 2023-03-20 NOTE — Progress Notes (Signed)
Can you please let this patient know that her testing to tomato was very slightly elevated. Continue to avoid tomato as ingestion has caused skin symptoms. Testing to pineapple, peanuts, and tree nuts was negative. She can incorporate peanuts, tree nuts, and pineapple into her diet if she is interested as she has not had any anaphylactic symptoms. She can also come into the clinic for a food challenge to these foods if interested. The stinging insect panel was borderline positive to yellow jacket. The next step would be to come into the [place] [facility] for skin testing to stinging insects. Please have her carry an epinephrine auto injector set. Thank you

## 2023-03-25 MED ORDER — EPINEPHRINE 0.3 MG/0.3ML IJ SOAJ: 0.3000 mg | INTRAMUSCULAR | 2 refills | Status: AC | PRN

## 2023-03-25 NOTE — Addendum Note (Signed)
Addended by: Elsworth Soho on: 03/25/2023 01:54 PM   Modules accepted: Orders

## 2023-04-29 ENCOUNTER — Encounter: Payer: Self-pay | Admitting: Family Medicine

## 2023-06-16 ENCOUNTER — Ambulatory Visit: Payer: Medicare Other | Admitting: Family Medicine

## 2023-08-30 ENCOUNTER — Other Ambulatory Visit: Payer: Self-pay | Admitting: Obstetrics & Gynecology

## 2023-08-30 DIAGNOSIS — Z79890 Hormone replacement therapy (postmenopausal): Secondary | ICD-10-CM

## 2023-09-01 NOTE — Telephone Encounter (Signed)
Med refill request: Estradiol Patch Last AEX: 12/24/2022-ML Next AEX: nothing scheduled and no recall in place.  Last MMG (if hormonal med): 12/18/2022-neg birads 1 Refill authorized: rx pend.  Recalls placed for 66yr med check appt and 27yr B&P since "LR" medicare pt.

## 2023-12-25 ENCOUNTER — Encounter: Payer: Self-pay | Admitting: Obstetrics and Gynecology

## 2023-12-31 ENCOUNTER — Ambulatory Visit: Payer: Medicare Other | Admitting: Obstetrics and Gynecology

## 2024-01-02 ENCOUNTER — Ambulatory Visit: Payer: Medicare Other | Admitting: Obstetrics and Gynecology

## 2024-01-07 ENCOUNTER — Ambulatory Visit: Payer: Medicare Other | Admitting: Obstetrics and Gynecology

## 2024-01-07 ENCOUNTER — Encounter: Payer: Self-pay | Admitting: Obstetrics and Gynecology

## 2024-01-07 VITALS — BP 124/62 | HR 63 | Temp 98.1°F | Wt 122.0 lb

## 2024-01-07 DIAGNOSIS — M8580 Other specified disorders of bone density and structure, unspecified site: Secondary | ICD-10-CM

## 2024-01-07 DIAGNOSIS — N951 Menopausal and female climacteric states: Secondary | ICD-10-CM | POA: Insufficient documentation

## 2024-01-07 MED ORDER — PAROXETINE HCL 10 MG PO TABS: 10.0000 mg | ORAL_TABLET | Freq: Every day | ORAL | 11 refills | Status: DC

## 2024-01-07 NOTE — Progress Notes (Signed)
 73 y.o. [id number] female s/p hysterectomy, LSO with VMS, VAIN (2002), mild SUI, CAD (s/p bypass in 2017), HTN, HLD, former tobacco use here for HRT follow-up. Married.  Patient's last menstrual period was 11/11/1984. Patient was previously under the care of Dr. Seymour Bars, who recommend using 1/2 patch and weaning over past year at last appt. Patient today reports using half a patch once a week and still experiencing hot flashes if she goes more than a week without her patch. She has tried black cohosh and other over-the-counter remedies without relief. Reports that her mother and sisters went through menopause without any issues.  Last mammogram: 12/24/2023 BI-RADS 1, density b  DEXA: April 2023 osteopenia -1.9 Rt Fem Neck with FRAX 13% / 2.6%.   GYN HISTORY: Status post hysterectomy, LSO [facility], 2002 Osteopenia  OB History  Gravida Para Term Preterm AB Living  3 2 2  1 2   SAB IAB Ectopic Multiple Live Births          # Outcome Date GA Lbr Len/2nd Weight Sex Type Anes PTL Lv  3 AB           2 Term           1 Term             Past Medical History:  Diagnosis Date   Allergic rhinitis    Back pain    Coronary artery disease 2017   had CABG   Hemorrhoids with complication    Hyperlipidemia    Hypertension    Hypothyroidism    IBS (irritable bowel syndrome)    Osteopenia 12/2017   T score -2.0 FRAX 12% / 2.6%   Pneumonia    VAIN (vaginal intraepithelial neoplasia) 2002    Past Surgical History:  Procedure Laterality Date   ABDOMINAL HYSTERECTOMY     ANTERIOR AND POSTERIOR REPAIR N/A 03/07/2015   Procedure: CYSTOSCOPE, CYSTOCELE  AND  ;  Surgeon: Alfredo Martinez, MD;  Location: [facility];  Service: Urology;  Laterality: N/A;   CORONARY ARTERY BYPASS GRAFT  09/09/2016   At [facility]   ELBOW SURGERY Right 07/2021   Torn Tendon   EYE SURGERY     lasik 2006   LAPAROSCOPY ABDOMEN DIAGNOSTIC     LEFT OOPHORECTOMY     PELVIC LAPAROSCOPY      VAGINAL PROLAPSE REPAIR N/A 03/07/2015   Procedure: VAULT PROLAPSE WITH GRAFT ;  Surgeon: Alfredo Martinez, MD;  Location: [facility];  Service: Urology;  Laterality: N/A;    Current Outpatient Medications on File Prior to Visit  Medication Sig Dispense Refill   aspirin EC 81 MG tablet Take 81 mg by mouth daily.     atenolol (TENORMIN) 25 MG tablet Take by mouth daily.     Calcium 500-100 MG-UNIT CHEW Chew by mouth. Chew 2 daily     cetirizine (ZYRTEC) 10 MG tablet Take 10 mg by mouth daily.     cyclobenzaprine (FLEXERIL) 5 MG tablet Take 5 mg by mouth 3 (three) times daily as needed.     EPINEPHrine (EPIPEN 2-PAK) 0.3 mg/0.3 mL IJ SOAJ injection Inject 0.3 mg into the muscle as needed for anaphylaxis. 2 each 2   hyoscyamine (LEVBID) 0.375 MG 12 hr tablet take 1 tablet by mouth every 12 hours if needed for cramping 60 tablet 0   Multiple Vitamin (MULTIVITAMIN) tablet Take 1 tablet by mouth daily.     simvastatin (ZOCOR) 40 MG tablet Take 40 mg by  mouth at bedtime.     Tetrahydrozoline HCl (VISINE OP) Apply 1 drop to eye every morning.     zolpidem (AMBIEN) 5 MG tablet Take 5 mg by mouth at bedtime as needed.     No current facility-administered medications on file prior to visit.    Allergies  Allergen Reactions   Penicillins Shortness Of Breath and Swelling    Angioedema   Sulfonamide Derivatives Shortness Of Breath and Swelling    Angioedema   Atorvastatin Other (See Comments)    myalgia   Trazodone And Nefazodone Other (See Comments)    Headache, dizzy   Tape     Causes blisters, and redness   Tetracycline Rash    hives      PE Today's Vitals   01/07/24 1155  BP: 124/62  Pulse: 63  Temp: 98.1 F (36.7 C)  TempSrc: Oral  SpO2: 99%  Weight: 122 lb (55.3 kg)   Body mass index is 23.83 kg/m.  Physical Exam Vitals reviewed.  Constitutional:      General: She is not in acute distress.    Appearance: Normal appearance.  HENT:     Head: Normocephalic and atraumatic.      Nose: Nose normal.  Eyes:     Extraocular Movements: Extraocular movements intact.     Conjunctiva/sclera: Conjunctivae normal.  Pulmonary:     Effort: Pulmonary effort is normal.  Musculoskeletal:        General: Normal range of motion.     Cervical back: Normal range of motion.  Neurological:     General: No focal deficit present.     Mental Status: She is alert.  Psychiatric:        Mood and Affect: Mood normal.        Behavior: Behavior normal.       Assessment and Plan:        Vasomotor symptoms due to menopause Assessment & Plan: 54yr CV risk: 12% Estrogen is now contraindicated based on age and CV risk. Patient would really like to continue HRT if possible. Discussed use of prometrium and nonhormonal options for VMS, including SSRIs and veozah. SSRIs can have side effects such as hypotension, mood irritability, GI upset, weight gain, sexual dysfunction, and drowsiness. Suicidal ideation can happpen in a subset of patient, and we reviewed that the medication would need to be stopped for this reason. Allyne Gee is contraindicated in patient with liver dysfunction and required q3 months CMP side effects include abdominal pain, diarrhea, back pain, and insomnia. Patient elects for paxil, as medicare does not usually cover veozah. RTO in 6wk for f/u.   Orders: -     PARoxetine HCl; Take 1 tablet (10 mg total) by mouth daily.  Dispense: 30 tablet; Refill: 11  Osteopenia, unspecified location -     DG Bone Density; Future   Continue vitamin D+Calcium   Rosalyn Gess, MD

## 2024-01-07 NOTE — Assessment & Plan Note (Signed)
 67yr CV risk: 12% Estrogen is now contraindicated based on age and CV risk. Patient would really like to continue HRT if possible. Discussed use of prometrium and nonhormonal options for VMS, including SSRIs and veozah. SSRIs can have side effects such as hypotension, mood irritability, GI upset, weight gain, sexual dysfunction, and drowsiness. Suicidal ideation can happpen in a subset of patient, and we reviewed that the medication would need to be stopped for this reason. Cindy Blankenship is contraindicated in patient with liver dysfunction and required q3 months CMP side effects include abdominal pain, diarrhea, back pain, and insomnia. Patient elects for paxil, as medicare does not usually cover veozah. RTO in 6wk for f/u.

## 2024-01-07 NOTE — Patient Instructions (Signed)
 For patients under 50-72yo, I recommend 1200mg  calcium daily and 600IU of vitamin D daily. For patients over 72yo, I recommend 1200mg  calcium daily and 800IU of vitamin D daily.

## 2024-02-16 ENCOUNTER — Ambulatory Visit (HOSPITAL_BASED_OUTPATIENT_CLINIC_OR_DEPARTMENT_OTHER)
Admission: RE | Admit: 2024-02-16 | Discharge: 2024-02-16 | Disposition: A | Discharge status: Home / Self Care | Payer: 59 | Source: Ambulatory Visit | Attending: Obstetrics and Gynecology | Admitting: Obstetrics and Gynecology

## 2024-02-16 DIAGNOSIS — M85851 Other specified disorders of bone density and structure, right thigh: Secondary | ICD-10-CM | POA: Insufficient documentation

## 2024-02-16 DIAGNOSIS — Z780 Asymptomatic menopausal state: Secondary | ICD-10-CM | POA: Diagnosis not present

## 2024-02-16 DIAGNOSIS — M8580 Other specified disorders of bone density and structure, unspecified site: Secondary | ICD-10-CM | POA: Insufficient documentation

## 2024-02-16 DIAGNOSIS — Z13820 Encounter for screening for osteoporosis: Secondary | ICD-10-CM | POA: Diagnosis present

## 2024-02-17 ENCOUNTER — Encounter: Payer: Self-pay | Admitting: Obstetrics and Gynecology

## 2024-02-17 ENCOUNTER — Ambulatory Visit: Admitting: Obstetrics and Gynecology

## 2024-02-17 VITALS — BP 138/64 | HR 78 | Temp 98.3°F

## 2024-02-17 DIAGNOSIS — M8580 Other specified disorders of bone density and structure, unspecified site: Secondary | ICD-10-CM

## 2024-02-17 DIAGNOSIS — N951 Menopausal and female climacteric states: Secondary | ICD-10-CM | POA: Diagnosis not present

## 2024-02-17 NOTE — Assessment & Plan Note (Addendum)
 68yr CV risk: 12%, HRT discontinued at February 2025 appointment.  Patient started on Paxil at that time.  Doing well with medication.  Continue until annual exam next year.

## 2024-02-17 NOTE — Progress Notes (Signed)
 73 y.o. [id number] female s/p hysterectomy, LSO with VMS, osteopenia, VAIN (2002), mild SUI, CAD (s/p bypass in 2017), HTN, HLD, former tobacco use here for HRT follow-up: Start Paxil 10 mg to 2625. Married.  Patient's last menstrual period was 11/11/1984. Patient was previously under the care of Dr. Seymour Bars, who recommend using 1/2 patch and weaning over past year at last appt. Patient today reports using half a patch once a week and still experiencing hot flashes if she goes more than a week without her patch. She has tried black cohosh and other over-the-counter remedies without relief. Reports that her mother and sisters went through menopause without any issues.  Today she reports no hot flashes since starting the medication.  Sleep is at baseline.  Denies any mood symptoms or GI distress.  No concerns of the medication.   She is currently being treated for UTI and has a mild headache today.  Last mammogram: 12/24/2023 BI-RADS 1, density b  DEXA: April 2023 osteopenia -1.9 Rt Fem Neck with FRAX 13% / 2.6%. April 2025 osteopenia T-score -1.8 at right femoral neck, stable, FRAX not calculated due to prior HRT  GYN HISTORY: Status post hysterectomy, [facility], [facility] Osteopenia  OB History  Gravida Para Term Preterm AB Living  3 2 2  1 2   SAB IAB Ectopic Multiple Live Births          # Outcome Date GA Lbr Len/2nd Weight Sex Type Anes PTL Lv  3 AB           2 Term           1 Term             Past Medical History:  Diagnosis Date   Allergic rhinitis    Back pain    Coronary artery disease 2017   had CABG   Hemorrhoids with complication    Hyperlipidemia    Hypertension    Hypothyroidism    IBS (irritable bowel syndrome)    Osteopenia 12/2017   T score -2.0 FRAX 12% / 2.6%   Pneumonia    VAIN (vaginal intraepithelial neoplasia) 2002    Past Surgical History:  Procedure Laterality Date   ABDOMINAL HYSTERECTOMY     ANTERIOR AND POSTERIOR REPAIR N/A 03/07/2015    Procedure: CYSTOSCOPE, CYSTOCELE  AND  ;  Surgeon: Alfredo Martinez, MD;  Location: [facility];  Service: Urology;  Laterality: N/A;   CORONARY ARTERY BYPASS GRAFT  09/09/2016   At [facility]   ELBOW SURGERY Right 07/2021   Torn Tendon   EYE SURGERY     lasik 2006   LAPAROSCOPY ABDOMEN DIAGNOSTIC     LEFT OOPHORECTOMY     PELVIC LAPAROSCOPY     VAGINAL PROLAPSE REPAIR N/A 03/07/2015   Procedure: VAULT PROLAPSE WITH GRAFT ;  Surgeon: Alfredo Martinez, MD;  Location: [facility];  Service: Urology;  Laterality: N/A;    Current Outpatient Medications on File Prior to Visit  Medication Sig Dispense Refill   aspirin EC 81 MG tablet Take 81 mg by mouth daily.     atenolol (TENORMIN) 25 MG tablet Take by mouth daily.     Calcium 500-100 MG-UNIT CHEW Chew by mouth. Chew 2 daily     cetirizine (ZYRTEC) 10 MG tablet Take 10 mg by mouth daily.     cyclobenzaprine (FLEXERIL) 5 MG tablet Take 5 mg by mouth 3 (three) times daily as needed.     EPINEPHrine (EPIPEN 2-PAK)  0.3 mg/0.3 mL IJ SOAJ injection Inject 0.3 mg into the muscle as needed for anaphylaxis. 2 each 2   hyoscyamine (LEVBID) 0.375 MG 12 hr tablet take 1 tablet by mouth every 12 hours if needed for cramping 60 tablet 0   levothyroxine (SYNTHROID) 50 MCG tablet Take 50 mcg by mouth daily.     Multiple Vitamin (MULTIVITAMIN) tablet Take 1 tablet by mouth daily.     nitrofurantoin, macrocrystal-monohydrate, (MACROBID) 100 MG capsule Take by mouth.     PARoxetine (PAXIL) 10 MG tablet Take 1 tablet (10 mg total) by mouth daily. 30 tablet 11   simvastatin (ZOCOR) 40 MG tablet Take 40 mg by mouth at bedtime.     Tetrahydrozoline HCl (VISINE OP) Apply 1 drop to eye every morning.     zolpidem (AMBIEN) 5 MG tablet Take 5 mg by mouth at bedtime as needed.     No current facility-administered medications on file prior to visit.    Allergies  Allergen Reactions   Penicillins Shortness Of Breath and Swelling    Angioedema    Sulfonamide Derivatives Shortness Of Breath and Swelling    Angioedema   Atorvastatin Other (See Comments)    myalgia   Trazodone And Nefazodone Other (See Comments)    Headache, dizzy   Tape     Causes blisters, and redness   Tetracycline Rash    hives      PE Today's Vitals   02/17/24 1231  BP: 138/64  Pulse: 78  Temp: 98.3 F (36.8 C)  TempSrc: Oral  SpO2: 98%    There is no height or weight on file to calculate BMI.  Physical Exam Vitals reviewed.  Constitutional:      General: She is not in acute distress.    Appearance: Normal appearance.  HENT:     Head: Normocephalic and atraumatic.     Nose: Nose normal.  Eyes:     Extraocular Movements: Extraocular movements intact.     Conjunctiva/sclera: Conjunctivae normal.  Pulmonary:     Effort: Pulmonary effort is normal.  Musculoskeletal:        General: Normal range of motion.     Cervical back: Normal range of motion.  Neurological:     General: No focal deficit present.     Mental Status: She is alert.  Psychiatric:        Mood and Affect: Mood normal.        Behavior: Behavior normal.       Assessment and Plan:        Vasomotor symptoms due to menopause Assessment & Plan: 34yr CV risk: 12%, HRT discontinued at February 2025 appointment.  Patient started on Paxil at that time.  Doing well with medication.  Continue until annual exam next year.   Osteopenia, unspecified location DEXA stable, continue vitamin D calcium and weightbearing exercise.  Rosalyn Gess, MD

## 2024-04-03 ENCOUNTER — Encounter: Payer: Self-pay | Admitting: Obstetrics and Gynecology

## 2024-04-03 DIAGNOSIS — N951 Menopausal and female climacteric states: Secondary | ICD-10-CM

## 2024-04-07 MED ORDER — PAROXETINE HCL 20 MG PO TABS: 20.0000 mg | ORAL_TABLET | Freq: Every day | ORAL | 3 refills | Status: AC

## 2024-08-18 ENCOUNTER — Encounter: Payer: Self-pay | Admitting: Gastroenterology

## 2024-09-21 ENCOUNTER — Encounter

## 2024-10-05 ENCOUNTER — Encounter: Admitting: Gastroenterology

## 2024-12-03 ENCOUNTER — Encounter: Payer: Self-pay | Admitting: Allergy & Immunology

## 2025-01-10 ENCOUNTER — Encounter: Admitting: Obstetrics and Gynecology
